# Patient Record
Sex: Male | Born: 1970 | Race: White | Hispanic: No | Marital: Single | Smoking: Current every day smoker
Health system: Southern US, Community
[De-identification: ages and names within clinical notes are randomized; demographics above are authoritative.]

## PROBLEM LIST (undated history)

## (undated) DIAGNOSIS — K8020 Calculus of gallbladder without cholecystitis without obstruction: Secondary | ICD-10-CM

## (undated) DIAGNOSIS — K219 Gastro-esophageal reflux disease without esophagitis: Secondary | ICD-10-CM

## (undated) DIAGNOSIS — K2970 Gastritis, unspecified, without bleeding: Secondary | ICD-10-CM

---

## 2001-05-28 ENCOUNTER — Emergency Department (HOSPITAL_COMMUNITY): Admission: EM | Admit: 2001-05-28 | Discharge: 2001-05-28 | Payer: Self-pay | Admitting: Emergency Medicine

## 2005-09-26 ENCOUNTER — Emergency Department (HOSPITAL_COMMUNITY): Admission: EM | Admit: 2005-09-26 | Discharge: 2005-09-26 | Payer: Self-pay | Admitting: Emergency Medicine

## 2007-07-03 ENCOUNTER — Emergency Department (HOSPITAL_COMMUNITY): Admission: EM | Admit: 2007-07-03 | Discharge: 2007-07-03 | Payer: Self-pay | Admitting: Emergency Medicine

## 2007-07-23 ENCOUNTER — Encounter: Admission: RE | Admit: 2007-07-23 | Discharge: 2007-07-23 | Payer: Self-pay | Admitting: Chiropractic Medicine

## 2009-12-06 ENCOUNTER — Emergency Department (HOSPITAL_COMMUNITY): Admission: EM | Admit: 2009-12-06 | Discharge: 2009-12-06 | Payer: Self-pay | Admitting: Emergency Medicine

## 2012-09-13 ENCOUNTER — Encounter (HOSPITAL_COMMUNITY): Payer: Self-pay | Admitting: *Deleted

## 2012-09-13 ENCOUNTER — Emergency Department (HOSPITAL_COMMUNITY)
Admission: EM | Admit: 2012-09-13 | Discharge: 2012-09-13 | Disposition: A | Discharge status: Home / Self Care | Payer: Self-pay | Attending: Emergency Medicine | Admitting: Emergency Medicine

## 2012-09-13 DIAGNOSIS — F17200 Nicotine dependence, unspecified, uncomplicated: Secondary | ICD-10-CM | POA: Insufficient documentation

## 2012-09-13 DIAGNOSIS — R220 Localized swelling, mass and lump, head: Secondary | ICD-10-CM | POA: Insufficient documentation

## 2012-09-13 DIAGNOSIS — K029 Dental caries, unspecified: Secondary | ICD-10-CM | POA: Insufficient documentation

## 2012-09-13 DIAGNOSIS — K047 Periapical abscess without sinus: Secondary | ICD-10-CM | POA: Insufficient documentation

## 2012-09-13 DIAGNOSIS — R51 Headache: Secondary | ICD-10-CM | POA: Insufficient documentation

## 2012-09-13 MED ORDER — OXYCODONE-ACETAMINOPHEN 5-325 MG PO TABS: 1.0000 | ORAL_TABLET | ORAL | Status: DC | PRN

## 2012-09-13 MED ORDER — PENICILLIN V POTASSIUM 250 MG PO TABS
500.0000 mg | ORAL_TABLET | Freq: Once | ORAL | Status: AC
  Administered 2012-09-13: 500 mg via ORAL
  Filled 2012-09-13: qty 2

## 2012-09-13 MED ORDER — PENICILLIN V POTASSIUM 500 MG PO TABS: 500.0000 mg | ORAL_TABLET | Freq: Three times a day (TID) | ORAL | Status: DC

## 2012-09-13 NOTE — ED Provider Notes (Signed)
Medical screening examination/treatment/procedure(s) were performed by non-physician practitioner and as supervising physician I was immediately available for consultation/collaboration.  Dione Booze, MD 09/13/12 [phone]

## 2012-09-13 NOTE — ED Notes (Signed)
Not answering

## 2012-09-13 NOTE — ED Notes (Signed)
The pt has had a  Toothache for 3 days

## 2012-09-13 NOTE — ED Provider Notes (Signed)
History     CSN: [id number]  Arrival date & time 09/13/12  2144   First MD Initiated Contact with Patient 09/13/12 2239      Chief Complaint  Patient presents with  . Dental Pain    (Consider location/radiation/quality/duration/timing/severity/associated sxs/prior treatment) Patient is a 42 y.o. male presenting with tooth pain. The history is provided by the patient.  Dental Pain Location:  Lower Associated symptoms: facial pain and facial swelling   Associated symptoms: no difficulty swallowing and no fever     History reviewed. No pertinent past medical history.  History reviewed. No pertinent past surgical history.  No family history on file.  History  Substance Use Topics  . Smoking status: Current Every Day Smoker  . Smokeless tobacco: Not on file  . Alcohol Use: Yes      Review of Systems  Constitutional: Negative for fever and chills.  HENT: Positive for facial swelling. Negative for ear pain and trouble swallowing.        Toothache.  Gastrointestinal: Negative.   Musculoskeletal: Negative for myalgias.  Neurological: Negative.     Allergies  Review of patient's allergies indicates no known allergies.  Home Medications   Current Outpatient Rx  Name  Route  Sig  Dispense  Refill  . acetaminophen (TYLENOL) 500 MG tablet   Oral   Take 500 mg by mouth every 6 (six) hours as needed for pain.         . benzocaine (ORAJEL) 10 % mucosal gel   Mouth/Throat   Use as directed 1 application in the mouth or throat every 10 (ten) minutes as needed for pain.           BP 123/76  Pulse 82  Temp(Src) 98 F (36.7 C)  Resp 20  SpO2 95%  Physical Exam  Constitutional: He is oriented to person, place, and time. He appears well-developed and well-nourished.  HENT:  Widespread dental decay with left facial swelling and tenderness.   Neck: Normal range of motion.  Pulmonary/Chest: Effort normal.  Musculoskeletal: Normal range of motion.  Neurological:  He is alert and oriented to person, place, and time.  Skin: Skin is warm and dry.  Psychiatric: He has a normal mood and affect.    ED Course  Procedures (including critical care time)  Labs Reviewed - No data to display No results found.   No diagnosis found.  1. Dental abscess  MDM  Dental abscess with widespread decay.         Arnoldo Hooker, PA-C 09/13/12 2335

## 2012-10-31 ENCOUNTER — Encounter (HOSPITAL_COMMUNITY): Payer: Self-pay | Admitting: Emergency Medicine

## 2012-10-31 ENCOUNTER — Emergency Department (HOSPITAL_COMMUNITY)
Admission: EM | Admit: 2012-10-31 | Discharge: 2012-10-31 | Disposition: A | Discharge status: Home / Self Care | Payer: Self-pay | Attending: Emergency Medicine | Admitting: Emergency Medicine

## 2012-10-31 DIAGNOSIS — F17200 Nicotine dependence, unspecified, uncomplicated: Secondary | ICD-10-CM | POA: Insufficient documentation

## 2012-10-31 DIAGNOSIS — R6883 Chills (without fever): Secondary | ICD-10-CM | POA: Insufficient documentation

## 2012-10-31 DIAGNOSIS — K029 Dental caries, unspecified: Secondary | ICD-10-CM | POA: Insufficient documentation

## 2012-10-31 MED ORDER — OXYCODONE-ACETAMINOPHEN 5-325 MG PO TABS
2.0000 | ORAL_TABLET | Freq: Once | ORAL | Status: AC
  Administered 2012-10-31: 2 via ORAL
  Filled 2012-10-31: qty 2

## 2012-10-31 MED ORDER — OXYCODONE-ACETAMINOPHEN 5-325 MG PO TABS: ORAL_TABLET | ORAL | Status: DC

## 2012-10-31 MED ORDER — AMOXICILLIN 500 MG PO CAPS: 500.0000 mg | ORAL_CAPSULE | Freq: Three times a day (TID) | ORAL | Status: DC

## 2012-10-31 NOTE — ED Notes (Signed)
Pt presents to emergency department today with tooth pain.  Pt states that he has been seen previous for same and was given antibiotics and pain meds for the tooth but because he has no dental insurance, he has not followed up with dentist.  Pain has been present for 5 days. None to very little facial swelling; however, tooth appears decayed.

## 2012-10-31 NOTE — ED Provider Notes (Signed)
Medical screening examination/treatment/procedure(s) were performed by non-physician practitioner and as supervising physician I was immediately available for consultation/collaboration.   Ashby Dawes, MD 10/31/12 [phone]

## 2012-10-31 NOTE — ED Notes (Signed)
Pt comfortable with d/c and f/u instructions. Prescriptions x2. 

## 2012-10-31 NOTE — ED Provider Notes (Signed)
History    This chart was scribed for Eric Flores, non-physician practitioner working with Ashby Dawes, MD by Leone Payor, ED Scribe. This patient was seen in room TR08C/TR08C and the patient's care was started at 1715.  CSN: [id number] Arrival date & time 10/31/12  1715  First MD Initiated Contact with Patient 10/31/12 1725     Chief Complaint  Patient presents with  . Dental Pain    The history is provided by the patient. No language interpreter was used.    HPI Comments: Eric Flores is a 42 y.o. male who presents to the Emergency Department complaining of ongoing, constant, gradually worsening dental pain to the upper left area starting 3-4 days ago. Pain is severe, 8/10,  Exacerbated by chewing. He reports having associated chills. Pt states he has been seen for similar symptoms 1 month ago for which he was prescribed antibiotics and pain medication. He denies following up with a dentist after his last tooth related visit to the ED. He has tried extra strength tylenol with relief. He denies fever, nausea, vomiting.   No past medical history on file. No past surgical history on file. No family history on file. History  Substance Use Topics  . Smoking status: Current Every Day Smoker  . Smokeless tobacco: Not on file  . Alcohol Use: Yes    Review of Systems  Constitutional: Negative for fever.  HENT: Positive for dental problem.   Respiratory: Negative for shortness of breath.   Cardiovascular: Negative for chest pain.  Gastrointestinal: Negative for nausea, vomiting, abdominal pain and diarrhea.  All other systems reviewed and are negative.    Allergies  Review of patient's allergies indicates no known allergies.  Home Medications   Current Outpatient Rx  Name  Route  Sig  Dispense  Refill  . acetaminophen (TYLENOL) 500 MG tablet   Oral   Take 500 mg by mouth every 6 (six) hours as needed for pain.         . benzocaine (ORAJEL) 10 % mucosal gel   Mouth/Throat   Use as directed 1 application in the mouth or throat every 10 (ten) minutes as needed for pain.         Marland Kitchen oxyCODONE-acetaminophen (PERCOCET/ROXICET) 5-325 MG per tablet   Oral   Take 1-2 tablets by mouth every 4 (four) hours as needed for pain.   15 tablet   0   . penicillin v potassium (VEETID) 500 MG tablet   Oral   Take 1 tablet (500 mg total) by mouth 3 (three) times daily.   30 tablet   0    BP 119/84  Pulse 76  Temp(Src) 97.4 F (36.3 C) (Oral)  Resp 18  SpO2 98% Physical Exam  Nursing note and vitals reviewed. Constitutional: He is oriented to person, place, and time. He appears well-developed and well-nourished. No distress.  HENT:  Head: Normocephalic.  Mouth/Throat: Oropharynx is clear and moist.    Generally poor dentition, no gingival swelling, erythema or tenderness to palpation. Patient is handling their secretions. There is no tenderness to palpation or firmness underneath tongue bilaterally. No trismus.    Eyes: Conjunctivae and EOM are normal. Pupils are equal, round, and reactive to light.  Neck: Normal range of motion.  Cardiovascular: Normal rate.   Pulmonary/Chest: Effort normal. No stridor.  Abdominal: Soft.  Musculoskeletal: Normal range of motion.  Neurological: He is alert and oriented to person, place, and time.  Psychiatric: He has a normal mood  and affect.    ED Course  Procedures (including critical care time)  DIAGNOSTIC STUDIES: Oxygen Saturation is 95% on RA, adequate by my interpretation.    COORDINATION OF CARE: 5:30 PM Discussed treatment plan with pt at bedside and pt agreed to plan.   Labs Reviewed - No data to display No results found. 1. Dental caries     MDM   Filed Vitals:   10/31/12 1724  BP: 119/84  Pulse: 76  Temp: 97.4 F (36.3 C)  TempSrc: Oral  Resp: 18  SpO2: 98%     Eric Flores is a 42 y.o. male Patient with toothache.  No gross abscess.  Exam unconcerning for Ludwig's angina or  spread of infection.  Will treat with penicillin and pain medicine.  Urged patient to follow-up with dentist.    Medications  oxyCODONE-acetaminophen (PERCOCET/ROXICET) 5-325 MG per tablet 2 tablet (not administered)    Pt is hemodynamically stable, appropriate for, and amenable to discharge at this time. Pt verbalized understanding and agrees with care plan. Outpatient follow-up and specific return precautions discussed.    New Prescriptions   AMOXICILLIN (AMOXIL) 500 MG CAPSULE    Take 1 capsule (500 mg total) by mouth 3 (three) times daily.   OXYCODONE-ACETAMINOPHEN (PERCOCET/ROXICET) 5-325 MG PER TABLET    1 to 2 tabs PO q6hrs  PRN for pain     Eric Emery, PA-C 10/31/12 1736

## 2013-04-29 ENCOUNTER — Encounter (HOSPITAL_COMMUNITY): Payer: Self-pay | Admitting: Emergency Medicine

## 2013-04-29 ENCOUNTER — Emergency Department (HOSPITAL_COMMUNITY): Payer: BC Managed Care – PPO

## 2013-04-29 ENCOUNTER — Emergency Department (HOSPITAL_COMMUNITY)
Admission: EM | Admit: 2013-04-29 | Discharge: 2013-04-29 | Disposition: A | Discharge status: Home / Self Care | Payer: BC Managed Care – PPO | Attending: Emergency Medicine | Admitting: Emergency Medicine

## 2013-04-29 DIAGNOSIS — R1013 Epigastric pain: Secondary | ICD-10-CM

## 2013-04-29 DIAGNOSIS — F17200 Nicotine dependence, unspecified, uncomplicated: Secondary | ICD-10-CM | POA: Insufficient documentation

## 2013-04-29 DIAGNOSIS — R1011 Right upper quadrant pain: Secondary | ICD-10-CM | POA: Insufficient documentation

## 2013-04-29 DIAGNOSIS — R110 Nausea: Secondary | ICD-10-CM | POA: Insufficient documentation

## 2013-04-29 LAB — CBC WITH DIFFERENTIAL/PLATELET
BASOS ABS: 0.1 10*3/uL (ref 0.0–0.1)
Basophils Relative: 0 % (ref 0–1)
EOS ABS: 0.2 10*3/uL (ref 0.0–0.7)
EOS PCT: 2 % (ref 0–5)
HCT: 44.2 % (ref 39.0–52.0)
Hemoglobin: 15.4 g/dL (ref 13.0–17.0)
LYMPHS ABS: 2.8 10*3/uL (ref 0.7–4.0)
Lymphocytes Relative: 23 % (ref 12–46)
MCH: 30.8 pg (ref 26.0–34.0)
MCHC: 34.8 g/dL (ref 30.0–36.0)
MCV: 88.4 fL (ref 78.0–100.0)
Monocytes Absolute: 0.7 10*3/uL (ref 0.1–1.0)
Monocytes Relative: 6 % (ref 3–12)
NEUTROS PCT: 69 % (ref 43–77)
Neutro Abs: 8.3 10*3/uL — ABNORMAL HIGH (ref 1.7–7.7)
PLATELETS: 283 10*3/uL (ref 150–400)
RBC: 5 MIL/uL (ref 4.22–5.81)
RDW: 12.5 % (ref 11.5–15.5)
WBC: 12.1 10*3/uL — AB (ref 4.0–10.5)

## 2013-04-29 LAB — COMPREHENSIVE METABOLIC PANEL
ALBUMIN: 4 g/dL (ref 3.5–5.2)
ALT: 40 U/L (ref 0–53)
AST: 32 U/L (ref 0–37)
Alkaline Phosphatase: 111 U/L (ref 39–117)
BUN: 10 mg/dL (ref 6–23)
CALCIUM: 9.4 mg/dL (ref 8.4–10.5)
CO2: 23 mEq/L (ref 19–32)
CREATININE: 0.85 mg/dL (ref 0.50–1.35)
Chloride: 102 mEq/L (ref 96–112)
GFR calc Af Amer: >90 mL/min (ref >90)
GFR calc non Af Amer: >90 mL/min (ref >90)
Glucose, Bld: 89 mg/dL (ref 70–99)
Potassium: 4.6 mEq/L (ref 3.7–5.3)
SODIUM: 140 meq/L (ref 137–147)
TOTAL PROTEIN: 7.3 g/dL (ref 6.0–8.3)
Total Bilirubin: 0.2 mg/dL — ABNORMAL LOW (ref 0.3–1.2)

## 2013-04-29 LAB — URINALYSIS, ROUTINE W REFLEX MICROSCOPIC
BILIRUBIN URINE: NEGATIVE
GLUCOSE, UA: NEGATIVE mg/dL
Hgb urine dipstick: NEGATIVE
KETONES UR: NEGATIVE mg/dL
LEUKOCYTES UA: NEGATIVE
Nitrite: NEGATIVE
PH: 6 (ref 5.0–8.0)
Protein, ur: NEGATIVE mg/dL
Specific Gravity, Urine: 1.022 (ref 1.005–1.030)
Urobilinogen, UA: 0.2 mg/dL (ref 0.0–1.0)

## 2013-04-29 LAB — LIPASE, BLOOD: LIPASE: 41 U/L (ref 11–59)

## 2013-04-29 MED ORDER — FAMOTIDINE 20 MG PO TABS
20.0000 mg | ORAL_TABLET | Freq: Two times a day (BID) | ORAL | Status: DC
Start: 2013-04-29 — End: 2013-12-04

## 2013-04-29 MED ORDER — PANTOPRAZOLE SODIUM 20 MG PO TBEC: 20.0000 mg | DELAYED_RELEASE_TABLET | Freq: Every day | ORAL | Status: DC

## 2013-04-29 NOTE — ED Notes (Signed)
Pt has been having abd pain epigastric that radates to llq since around x mas time. Pain is worse at night when lying down. Pt has had bm normal , states that he has notice maybe it does get worse after eating .

## 2013-04-29 NOTE — Discharge Instructions (Signed)
Your ultrasound and blood work did not show significant findings. See diet recommendations below. Follow up with primary care doctor. Diet for Gastroesophageal Reflux Disease, Adult Reflux (acid reflux) is when acid from your stomach flows up into the esophagus. When acid comes in contact with the esophagus, the acid causes irritation and soreness (inflammation) in the esophagus. When reflux happens often or so severely that it causes damage to the esophagus, it is called gastroesophageal reflux disease (GERD). Nutrition therapy can help ease the discomfort of GERD. FOODS OR DRINKS TO AVOID OR LIMIT  Smoking or chewing tobacco. Nicotine is one of the most potent stimulants to acid production in the gastrointestinal tract.  Caffeinated and decaffeinated coffee and black tea.  Regular or low-calorie carbonated beverages or energy drinks (caffeine-free carbonated beverages are allowed).   Strong spices, such as black pepper, white pepper, red pepper, cayenne, curry powder, and chili powder.  Peppermint or spearmint.  Chocolate.  High-fat foods, including meats and fried foods. Extra added fats including oils, butter, salad dressings, and nuts. Limit these to less than 8 tsp per day.  Fruits and vegetables if they are not tolerated, such as citrus fruits or tomatoes.  Alcohol.  Any food that seems to aggravate your condition. If you have questions regarding your diet, call your caregiver or a registered dietitian. OTHER THINGS THAT MAY HELP GERD INCLUDE:   Eating your meals slowly, in a relaxed setting.  Eating 5 to 6 small meals per day instead of 3 large meals.  Eliminating food for a period of time if it causes distress.  Not lying down until 3 hours after eating a meal.  Keeping the head of your bed raised 6 to 9 inches (15 to 23 cm) by using a foam wedge or blocks under the legs of the bed. Lying flat may make symptoms worse.  Being physically active. Weight loss may be helpful  in reducing reflux in overweight or obese adults.  Wear loose fitting clothing EXAMPLE MEAL PLAN This meal plan is approximately 2,000 calories based on https://www.[place].org/ChooseMyPlate.gov meal planning guidelines. Breakfast   cup cooked oatmeal.  1 cup strawberries.  1 cup low-fat milk.  1 oz almonds. Snack  1 cup cucumber slices.  6 oz yogurt (made from low-fat or fat-free milk). Lunch  2 slice whole-wheat bread.  2 oz sliced [place].  2 tsp mayonnaise.  1 cup blueberries.  1 cup snap peas. Snack  6 whole-wheat crackers.  1 oz string cheese. Dinner   cup brown rice.  1 cup mixed veggies.  1 tsp olive oil.  3 oz grilled fish. Document Released: 04/03/2005 Document Revised: 06/26/2011 Document Reviewed: 02/17/2011 [facility] Patient Information 2014 [employer], [place].

## 2013-04-29 NOTE — ED Provider Notes (Signed)
CSN: [id number]     Arrival date & time 04/29/13  1511 History   First MD Initiated Contact with Patient 04/29/13 1644     Chief Complaint  Patient presents with  . Abdominal Pain   (Consider location/radiation/quality/duration/timing/severity/associated sxs/prior Treatment) HPI Eric Flores is a 43 y.o. male who presents to emergency department complaining of abdominal pain. Patient states that he has had pain in his abdomen since New Year's Day. He states this pain comes and goes. States it's mainly after eating and at nighttime. He reports pain is in epigastric area in the right upper quadrant and radiates to the back. He reports associated nausea. He denies any vomiting or diarrhea. He states pain is also worse when laying down flat. He has not tried any medications for this. He denies any prior similar symptoms. He states the pain comes and goes. He reports no current pain. He states the reason he came to the ER because that is the pain he had this morning with severe.  History reviewed. No pertinent past medical history. History reviewed. No pertinent past surgical history. No family history on file. History  Substance Use Topics  . Smoking status: Current Every Day Smoker  . Smokeless tobacco: Never Used  . Alcohol Use: Yes     Comment: occassional    Review of Systems  Constitutional: Negative for fever and chills.  HENT: Negative for congestion.   Respiratory: Negative for cough, chest tightness and shortness of breath.   Cardiovascular: Negative for chest pain, palpitations and leg swelling.  Gastrointestinal: Positive for nausea and abdominal pain. Negative for vomiting, diarrhea and abdominal distention.  Genitourinary: Negative for dysuria, urgency, frequency and hematuria.  Musculoskeletal: Negative for arthralgias, myalgias, neck pain and neck stiffness.  Skin: Negative for rash.  Allergic/Immunologic: Negative for immunocompromised state.  Neurological: Negative for  dizziness, weakness, light-headedness, numbness and headaches.    Allergies  Review of patient's allergies indicates no known allergies.  Home Medications   Current Outpatient Rx  Name  Route  Sig  Dispense  Refill  . acetaminophen (TYLENOL) 500 MG tablet   Oral   Take 500 mg by mouth every 6 (six) hours as needed for pain.         Marland Kitchen ibuprofen (ADVIL,MOTRIN) 200 MG tablet   Oral   Take 400 mg by mouth every 6 (six) hours as needed for moderate pain.         . Multiple Vitamin (MULTIVITAMIN WITH MINERALS) TABS tablet   Oral   Take 1 tablet by mouth daily.          BP 125/71  Pulse 80  Temp(Src) 98.2 F (36.8 C) (Oral)  Resp 16  SpO2 96% Physical Exam  Nursing note and vitals reviewed. Constitutional: He is oriented to person, place, and time. He appears well-developed and well-nourished. No distress.  HENT:  Head: Normocephalic.  Eyes: Conjunctivae are normal.  Neck: Neck supple.  Cardiovascular: Normal rate, regular rhythm and normal heart sounds.   Pulmonary/Chest: Effort normal and breath sounds normal. No respiratory distress. He has no wheezes. He has no rales.  Abdominal: Soft. Bowel sounds are normal. He exhibits no distension. There is tenderness. There is no rebound and no guarding.  RUQ tenderness, epigastric tenderness  Musculoskeletal: He exhibits no edema.  Neurological: He is alert and oriented to person, place, and time.  Skin: Skin is warm and dry.    ED Course  Procedures (including critical care time) Labs Review Labs Reviewed  CBC WITH DIFFERENTIAL - Abnormal; Notable for the following:    WBC 12.1 (*)    Neutro Abs 8.3 (*)    All other components within normal limits  COMPREHENSIVE METABOLIC PANEL - Abnormal; Notable for the following:    Total Bilirubin 0.2 (*)    All other components within normal limits  URINALYSIS, ROUTINE W REFLEX MICROSCOPIC  LIPASE, BLOOD   Imaging Review [place] Abdomen Complete  04/29/2013   CLINICAL DATA:   Right upper quadrant pain  EXAM: ULTRASOUND ABDOMEN COMPLETE  COMPARISON:  None.  FINDINGS: Gallbladder:  Contracted consistent with postprandial state. No findings to suggest cholelithiasis are noted.  Common bile duct:  Diameter: 3.3 mm.  Liver:  No focal lesion identified. Within normal limits in parenchymal echogenicity.  IVC:  No abnormality visualized.  Pancreas:  Visualized portion unremarkable.  Spleen:  Size and appearance within normal limits.  Right Kidney:  Length: 10.7 cm. Echogenicity within normal limits. No mass or hydronephrosis visualized.  Left Kidney:  Length: 10.8 cm. Echogenicity within normal limits. No mass or hydronephrosis visualized.  Abdominal aorta:  No aneurysm visualized.  Other findings:  None.  IMPRESSION: Contracted gallbladder due to the postprandial state.  No other focal abnormality is noted.   Electronically Signed   By: Alcide CleverMark  Lukens M.D.   On: 04/29/2013 19:03    EKG Interpretation   None       MDM   1. Epigastric abdominal pain     Pt with epigastric pain and RUQ pain. Pain is intermittent. Currently pain free. Labs and US abdomen pending to rule out cholecystitis.    8:09 PM Pt's US negative except for contracted gallbladder, so suspect not best quality test at this time. . Labs unremarkable other than elevate wbc at 12 with no left shift. Pt is currently symptom free. Given negative labs, US, pt feeling well at present will d/c home with outpatient follow up. Instructed to improve his diet, cut down on alcohol, quit smoking, reduce caffeine, no spicy foods, no NSAIDs. Pt states he takes NSAIDs daily for chronic pain. Will start on pepcid and prilosec. Follow up with pcp.   Filed Vitals:   04/29/13 1527  BP: 125/71  Pulse: 80  Temp: 98.2 F (36.8 C)  TempSrc: Oral  Resp: 16  SpO2: 96%      Lottie Musselatyana A Esiquio Boesen, PA-C 04/29/13 2011

## 2013-04-30 NOTE — ED Provider Notes (Signed)
Medical screening examination/treatment/procedure(s) were conducted as a shared visit with non-physician practitioner(s) and myself.  I personally evaluated the patient during the encounter.  EKG Interpretation   None       Pt c/o right upper abd pain. Intermittent.?worse w certain foods or when eats heavy meal. No cp. No sob. No fever or chills. abd soft mild ruq tenderness.   Eric RootsKevin E Denetra Formoso, MD 04/30/13 [phone]

## 2013-11-11 ENCOUNTER — Encounter (HOSPITAL_COMMUNITY): Payer: Self-pay | Admitting: Emergency Medicine

## 2013-11-11 ENCOUNTER — Emergency Department (HOSPITAL_COMMUNITY)
Admission: EM | Admit: 2013-11-11 | Discharge: 2013-11-12 | Disposition: A | Discharge status: Home / Self Care | Payer: BC Managed Care – PPO | Attending: Emergency Medicine | Admitting: Emergency Medicine

## 2013-11-11 DIAGNOSIS — R197 Diarrhea, unspecified: Secondary | ICD-10-CM | POA: Insufficient documentation

## 2013-11-11 DIAGNOSIS — R112 Nausea with vomiting, unspecified: Secondary | ICD-10-CM | POA: Insufficient documentation

## 2013-11-11 DIAGNOSIS — Z79899 Other long term (current) drug therapy: Secondary | ICD-10-CM | POA: Insufficient documentation

## 2013-11-11 MED ORDER — ONDANSETRON HCL 4 MG/2ML IJ SOLN
4.0000 mg | Freq: Once | INTRAMUSCULAR | Status: AC
  Administered 2013-11-12: 4 mg via INTRAVENOUS
  Filled 2013-11-11: qty 2

## 2013-11-11 MED ORDER — SODIUM CHLORIDE 0.9 % IV SOLN
1000.0000 mL | INTRAVENOUS | Status: DC
  Administered 2013-11-12: 1000 mL via INTRAVENOUS

## 2013-11-11 MED ORDER — LOPERAMIDE HCL 2 MG PO CAPS
4.0000 mg | ORAL_CAPSULE | Freq: Once | ORAL | Status: AC
  Administered 2013-11-12: 4 mg via ORAL
  Filled 2013-11-11: qty 2

## 2013-11-11 MED ORDER — SODIUM CHLORIDE 0.9 % IV SOLN
1000.0000 mL | Freq: Once | INTRAVENOUS | Status: AC
  Administered 2013-11-12: 1000 mL via INTRAVENOUS

## 2013-11-11 NOTE — ED Provider Notes (Signed)
CSN: [id number]     Arrival date & time 11/11/13  2250 History  This chart was scribed for Dione Booze, MD by Nicholos Johns, ED scribe. This patient was seen in room APA18/APA18 and the patient's care was started at 11:46 PM.    Chief Complaint  Patient presents with  . Emesis   The history is provided by the patient. No language interpreter was used.   HPI Comments: Eric Flores is a 43 y.o. male w/ hx of gastritis presents to the Emergency Department complaining of nausea, vomiting, and diarrhea; onset last PM. Reports 4-5 episodes of emesis. 2-3 episodes of diarrhea. Subjective fever this AM; did not take a temperature. Notes 8/10 abdominal cramping. States his mother has a stomach virus and believes he may have caught the same thing. Admits to smoking 1 pack of cigarettes daily.   History reviewed. No pertinent past medical history. History reviewed. No pertinent past surgical history. History reviewed. No pertinent family history. History  Substance Use Topics  . Smoking status: Current Every Day Smoker  . Smokeless tobacco: Never Used  . Alcohol Use: Yes     Comment: occassional    Review of Systems  Constitutional: Negative for fever.  Gastrointestinal: Positive for vomiting and diarrhea.  All other systems reviewed and are negative.  Allergies  Review of patient's allergies indicates no known allergies.  Home Medications   Prior to Admission medications   Medication Sig Start Date End Date Taking? Authorizing Provider  acetaminophen (TYLENOL) 500 MG tablet Take 500 mg by mouth every 6 (six) hours as needed for pain.    Historical Provider, MD  famotidine (PEPCID) 20 MG tablet Take 1 tablet (20 mg total) by mouth 2 (two) times daily. 04/29/13   Tatyana A Kirichenko, PA-C  ibuprofen (ADVIL,MOTRIN) 200 MG tablet Take 400 mg by mouth every 6 (six) hours as needed for moderate pain.    Historical Provider, MD  Multiple Vitamin (MULTIVITAMIN WITH MINERALS) TABS tablet Take  1 tablet by mouth daily.    Historical Provider, MD  pantoprazole (PROTONIX) 20 MG tablet Take 1 tablet (20 mg total) by mouth daily. 04/29/13   Tatyana A Kirichenko, PA-C   Triage vitals: BP 96/78  Pulse 71  Temp(Src) 98.2 F (36.8 C) (Oral)  Resp 16  Ht 5\' 8"  (1.727 m)  Wt 150 lb (68.04 kg)  BMI 22.81 kg/m2  SpO2 98%  Physical Exam  Nursing note and vitals reviewed. Constitutional: He is oriented to person, place, and time. He appears well-developed and well-nourished. No distress.  HENT:  Head: Normocephalic and atraumatic.  Eyes: Conjunctivae and EOM are normal. Pupils are equal, round, and reactive to light.  Neck: Normal range of motion. Neck supple. No JVD present. No tracheal deviation present.  Cardiovascular: Normal rate and regular rhythm.   No murmur heard. Pulmonary/Chest: Effort normal and breath sounds normal. No respiratory distress. He has no wheezes. He has no rales.  Abdominal: Soft. He exhibits no distension and no mass. Bowel sounds are decreased. There is no tenderness.  Musculoskeletal: Normal range of motion. He exhibits no edema.  Lymphadenopathy:    He has no cervical adenopathy.  Neurological: He is alert and oriented to person, place, and time. He has normal reflexes. No cranial nerve deficit. He exhibits normal muscle tone. Coordination normal.  Skin: Skin is warm and dry. No rash noted.  Psychiatric: He has a normal mood and affect. His behavior is normal. Thought content normal.    ED Course  Procedures (including critical care time) DIAGNOSTIC STUDIES: Oxygen Saturation is 98% on room air, normal by my interpretation.    COORDINATION OF CARE: At 11:49 PM: Discussed treatment plan with patient which includes IV fluids and medication for the nausea and diarrhea. Patient agrees.    Labs Review Results for orders placed during the hospital encounter of 11/11/13  CBC WITH DIFFERENTIAL      Result Value Ref Range   WBC 8.8  4.0 - 10.5 K/uL   RBC  4.84  4.22 - 5.81 MIL/uL   Hemoglobin 15.0  13.0 - 17.0 g/dL   HCT 16.143.5  09.639.0 - 04.552.0 %   MCV 89.9  78.0 - 100.0 fL   MCH 31.0  26.0 - 34.0 pg   MCHC 34.5  30.0 - 36.0 g/dL   RDW 40.913.1  81.111.5 - 91.415.5 %   Platelets 266  150 - 400 K/uL   Neutrophils Relative % 47  43 - 77 %   Neutro Abs 4.2  1.7 - 7.7 K/uL   Lymphocytes Relative 41  12 - 46 %   Lymphs Abs 3.6  0.7 - 4.0 K/uL   Monocytes Relative 9  3 - 12 %   Monocytes Absolute 0.8  0.1 - 1.0 K/uL   Eosinophils Relative 2  0 - 5 %   Eosinophils Absolute 0.2  0.0 - 0.7 K/uL   Basophils Relative 1  0 - 1 %   Basophils Absolute 0.1  0.0 - 0.1 K/uL  BASIC METABOLIC PANEL      Result Value Ref Range   Sodium 140  137 - 147 mEq/L   Potassium 4.0  3.7 - 5.3 mEq/L   Chloride 101  96 - 112 mEq/L   CO2 28  19 - 32 mEq/L   Glucose, Bld 104 (*) 70 - 99 mg/dL   BUN 16  6 - 23 mg/dL   Creatinine, Ser 7.820.94  0.50 - 1.35 mg/dL   Calcium 9.4  8.4 - 95.610.5 mg/dL   GFR calc non Af Amer >90  >90 mL/min   GFR calc Af Amer >90  >90 mL/min   Anion gap 11  5 - 15   MDM   Final diagnoses:  Nausea vomiting and diarrhea   Nausea, vomiting, diarrhea, body aches consistent with viral gastroenteritis. He'll be given IV fluids, IV ondansetron, and oral loperamide.  Laboratory workup was unremarkable. He feels significantly better after above noted treatment. She is sent home with a prescription for ondansetron and is told to use over-the-counter loperamide as needed.  I personally performed the services described in this documentation, which was scribed in my presence. The recorded information has been reviewed and is accurate.     Dione Boozeavid Molly Maselli, MD 11/12/13 [phone]

## 2013-11-11 NOTE — ED Notes (Signed)
Patient complaining of nausea, vomiting, diarrhea.

## 2013-11-12 LAB — CBC WITH DIFFERENTIAL/PLATELET
Basophils Absolute: 0.1 10*3/uL (ref 0.0–0.1)
Basophils Relative: 1 % (ref 0–1)
EOS ABS: 0.2 10*3/uL (ref 0.0–0.7)
EOS PCT: 2 % (ref 0–5)
HEMATOCRIT: 43.5 % (ref 39.0–52.0)
HEMOGLOBIN: 15 g/dL (ref 13.0–17.0)
LYMPHS ABS: 3.6 10*3/uL (ref 0.7–4.0)
Lymphocytes Relative: 41 % (ref 12–46)
MCH: 31 pg (ref 26.0–34.0)
MCHC: 34.5 g/dL (ref 30.0–36.0)
MCV: 89.9 fL (ref 78.0–100.0)
MONO ABS: 0.8 10*3/uL (ref 0.1–1.0)
MONOS PCT: 9 % (ref 3–12)
NEUTROS PCT: 47 % (ref 43–77)
Neutro Abs: 4.2 10*3/uL (ref 1.7–7.7)
Platelets: 266 10*3/uL (ref 150–400)
RBC: 4.84 MIL/uL (ref 4.22–5.81)
RDW: 13.1 % (ref 11.5–15.5)
WBC: 8.8 10*3/uL (ref 4.0–10.5)

## 2013-11-12 LAB — BASIC METABOLIC PANEL
Anion gap: 11 (ref 5–15)
BUN: 16 mg/dL (ref 6–23)
CO2: 28 mEq/L (ref 19–32)
CREATININE: 0.94 mg/dL (ref 0.50–1.35)
Calcium: 9.4 mg/dL (ref 8.4–10.5)
Chloride: 101 mEq/L (ref 96–112)
GFR calc Af Amer: >90 mL/min (ref >90)
GLUCOSE: 104 mg/dL — AB (ref 70–99)
POTASSIUM: 4 meq/L (ref 3.7–5.3)
Sodium: 140 mEq/L (ref 137–147)

## 2013-11-12 MED ORDER — ONDANSETRON HCL 4 MG PO TABS: 4.0000 mg | ORAL_TABLET | Freq: Four times a day (QID) | ORAL | Status: DC | PRN

## 2013-11-12 NOTE — Discharge Instructions (Signed)
Take loperamide (Imodium AD) as needed for diarrhea. ° °Nausea and Vomiting °Nausea is a sick feeling that often comes before throwing up (vomiting). Vomiting is a reflex where stomach contents come out of your mouth. Vomiting can cause severe loss of body fluids (dehydration). Children and elderly adults can become dehydrated quickly, especially if they also have diarrhea. Nausea and vomiting are symptoms of a condition or disease. It is important to find the cause of your symptoms. °CAUSES  °· Direct irritation of the stomach lining. This irritation can result from increased acid production (gastroesophageal reflux disease), infection, food poisoning, taking certain medicines (such as nonsteroidal anti-inflammatory drugs), alcohol use, or tobacco use. °· Signals from the brain. These signals could be caused by a headache, heat exposure, an inner ear disturbance, increased pressure in the brain from injury, infection, a tumor, or a concussion, pain, emotional stimulus, or metabolic problems. °· An obstruction in the gastrointestinal tract (bowel obstruction). °· Illnesses such as diabetes, hepatitis, gallbladder problems, appendicitis, kidney problems, cancer, sepsis, atypical symptoms of a heart attack, or eating disorders. °· Medical treatments such as chemotherapy and radiation. °· Receiving medicine that makes you sleep (general anesthetic) during surgery. °DIAGNOSIS °Your caregiver may ask for tests to be done if the problems do not improve after a few days. Tests may also be done if symptoms are severe or if the reason for the nausea and vomiting is not clear. Tests may include: °· Urine tests. °· Blood tests. °· Stool tests. °· Cultures (to look for evidence of infection). °· X-rays or other imaging studies. °Test results can help your caregiver make decisions about treatment or the need for additional tests. °TREATMENT °You need to stay well hydrated. Drink frequently but in small amounts. You may wish to  drink water, sports drinks, clear broth, or eat frozen ice pops or gelatin dessert to help stay hydrated. When you eat, eating slowly may help prevent nausea. There are also some antinausea medicines that may help prevent nausea. °HOME CARE INSTRUCTIONS  °· Take all medicine as directed by your caregiver. °· If you do not have an appetite, do not force yourself to eat. However, you must continue to drink fluids. °· If you have an appetite, eat a normal diet unless your caregiver tells you differently. °· Eat a variety of complex carbohydrates (rice, wheat, potatoes, bread), lean meats, yogurt, fruits, and vegetables. °· Avoid high-fat foods because they are more difficult to digest. °· Drink enough water and fluids to keep your urine clear or pale yellow. °· If you are dehydrated, ask your caregiver for specific rehydration instructions. Signs of dehydration may include: °· Severe thirst. °· Dry lips and mouth. °· Dizziness. °· Dark urine. °· Decreasing urine frequency and amount. °· Confusion. °· Rapid breathing or pulse. °SEEK IMMEDIATE MEDICAL CARE IF:  °· You have blood or brown flecks (like coffee grounds) in your vomit. °· You have black or bloody stools. °· You have a severe headache or stiff neck. °· You are confused. °· You have severe abdominal pain. °· You have chest pain or trouble breathing. °· You do not urinate at least once every 8 hours. °· You develop cold or clammy skin. °· You continue to vomit for longer than 24 to 48 hours. °· You have a fever. °MAKE SURE YOU:  °· Understand these instructions. °· Will watch your condition. °· Will get help right away if you are not doing well or get worse. °Document Released: 04/03/2005 Document Revised: 06/26/2011 Document Reviewed: 08/31/2010 °ExitCare® Patient   Information ©2015 [employer]. This information is not intended to replace advice given to you by your health care provider. Make sure you discuss any questions you have with your health care  provider. ° °Diarrhea °Diarrhea is frequent loose and watery bowel movements. It can cause you to feel weak and dehydrated. Dehydration can cause you to become tired and thirsty, have a dry mouth, and have decreased urination that often is dark yellow. Diarrhea is a sign of another problem, most often an infection that will not last long. In most cases, diarrhea typically lasts 2-3 days. However, it can last longer if it is a sign of something more serious. It is important to treat your diarrhea as directed by your caregiver to lessen or prevent future episodes of diarrhea. °CAUSES  °Some common causes include: °· Gastrointestinal infections caused by viruses, bacteria, or parasites. °· Food poisoning or food allergies. °· Certain medicines, such as antibiotics, chemotherapy, and laxatives. °· Artificial sweeteners and fructose. °· Digestive disorders. °HOME CARE INSTRUCTIONS °· Ensure adequate fluid intake (hydration): Have 1 cup (8 oz) of fluid for each diarrhea episode. Avoid fluids that contain simple sugars or sports drinks, fruit juices, whole milk products, and sodas. Your urine should be clear or pale yellow if you are drinking enough fluids. Hydrate with an oral rehydration solution that you can purchase at pharmacies, retail stores, and online. You can prepare an oral rehydration solution at home by mixing the following ingredients together: °¨  - tsp table salt. °¨ ¾ tsp baking soda. °¨  tsp salt substitute containing potassium chloride. °¨ 1  tablespoons sugar. °¨ 1 L (34 oz) of water. °· Certain foods and beverages may increase the speed at which food moves through the gastrointestinal (GI) tract. These foods and beverages should be avoided and include: °¨ Caffeinated and alcoholic beverages. °¨ High-fiber foods, such as raw fruits and vegetables, nuts, seeds, and whole grain breads and cereals. °¨ Foods and beverages sweetened with sugar alcohols, such as xylitol, sorbitol, and mannitol. °· Some foods  may be well tolerated and may help thicken stool including: °¨ Starchy foods, such as rice, toast, pasta, low-sugar cereal, oatmeal, grits, baked potatoes, crackers, and bagels. °¨ Bananas. °¨ Applesauce. °· Add probiotic-rich foods to help increase healthy bacteria in the GI tract, such as yogurt and fermented milk products. °· Wash your hands well after each diarrhea episode. °· Only take over-the-counter or prescription medicines as directed by your caregiver. °· Take a warm bath to relieve any burning or pain from frequent diarrhea episodes. °SEEK IMMEDIATE MEDICAL CARE IF:  °· You are unable to keep fluids down. °· You have persistent vomiting. °· You have blood in your stool, or your stools are black and tarry. °· You do not urinate in 6-8 hours, or there is only a small amount of very dark urine. °· You have abdominal pain that increases or localizes. °· You have weakness, dizziness, confusion, or light-headedness. °· You have a severe headache. °· Your diarrhea gets worse or does not get better. °· You have a fever or persistent symptoms for more than 2-3 days. °· You have a fever and your symptoms suddenly get worse. °MAKE SURE YOU:  °· Understand these instructions. °· Will watch your condition. °· Will get help right away if you are not doing well or get worse. °Document Released: 03/24/2002 Document Revised: 08/18/2013 Document Reviewed: 12/10/2011 °ExitCare® Patient Information ©2015 [employer]. This information is not intended to replace advice given to you   by your health care provider. Make sure you discuss any questions you have with your health care provider. ° °Ondansetron tablets °What is this medicine? °ONDANSETRON (on DAN se tron) is used to treat nausea and vomiting caused by chemotherapy. It is also used to prevent or treat nausea and vomiting after surgery. °This medicine may be used for other purposes; ask your health care provider or pharmacist if you have questions. °COMMON BRAND  NAME(S): Zofran °What should I tell my health care provider before I take this medicine? °They need to know if you have any of these conditions: °-heart disease °-history of irregular heartbeat °-liver disease °-low levels of magnesium or potassium in the blood °-an unusual or allergic reaction to ondansetron, granisetron, other medicines, foods, dyes, or preservatives °-pregnant or trying to get pregnant °-breast-feeding °How should I use this medicine? °Take this medicine by mouth with a glass of water. Follow the directions on your prescription label. Take your doses at regular intervals. Do not take your medicine more often than directed. °Talk to your pediatrician regarding the use of this medicine in children. Special care may be needed. °Overdosage: If you think you have taken too much of this medicine contact a poison control center or emergency room at once. °NOTE: This medicine is only for you. Do not share this medicine with others. °What if I miss a dose? °If you miss a dose, take it as soon as you can. If it is almost time for your next dose, take only that dose. Do not take double or extra doses. °What may interact with this medicine? °Do not take this medicine with any of the following medications: °-apomorphine °-certain medicines for fungal infections like fluconazole, itraconazole, ketoconazole, posaconazole, voriconazole °-cisapride °-dofetilide °-dronedarone °-pimozide °-thioridazine °-ziprasidone °This medicine may also interact with the following medications: °-carbamazepine °-certain medicines for depression, anxiety, or psychotic disturbances °-fentanyl °-linezolid °-MAOIs like Carbex, Eldepryl, Marplan, Nardil, and Parnate °-methylene blue (injected into a vein) °-other medicines that prolong the QT interval (cause an abnormal heart rhythm) °-phenytoin °-rifampicin °-tramadol °This list may not describe all possible interactions. Give your health care provider a list of all the medicines,  herbs, non-prescription drugs, or dietary supplements you use. Also tell them if you smoke, drink alcohol, or use illegal drugs. Some items may interact with your medicine. °What should I watch for while using this medicine? °Check with your doctor or health care professional right away if you have any sign of an allergic reaction. °What side effects may I notice from receiving this medicine? °Side effects that you should report to your doctor or health care professional as soon as possible: °-allergic reactions like skin rash, itching or hives, swelling of the face, lips or tongue °-breathing problems °-confusion °-dizziness °-fast or irregular heartbeat °-feeling faint or lightheaded, falls °-fever and chills °-loss of balance or coordination °-seizures °-sweating °-swelling of the hands or feet °-tightness in the chest °-tremors °-unusually weak or tired °Side effects that usually do not require medical attention (report to your doctor or health care professional if they continue or are bothersome): °-constipation or diarrhea °-headache °This list may not describe all possible side effects. Call your doctor for medical advice about side effects. You may report side effects to FDA at 1-800-FDA-1088. °Where should I keep my medicine? °Keep out of the reach of children. °Store between 2 and 30 degrees C (36 and 86 degrees F). Throw away any unused medicine after the expiration date. °NOTE: This sheet is a   summary. It may not cover all possible information. If you have questions about this medicine, talk to your doctor, pharmacist, or health care provider. °© 2015, [employer]. (2013-01-08 16:27:45) ° °

## 2013-11-24 MED FILL — Ondansetron HCl Tab 4 MG: ORAL | Qty: 4 | Status: AC

## 2013-12-04 ENCOUNTER — Encounter (HOSPITAL_COMMUNITY): Payer: Self-pay | Admitting: Emergency Medicine

## 2013-12-04 ENCOUNTER — Emergency Department (HOSPITAL_COMMUNITY): Payer: BC Managed Care – PPO

## 2013-12-04 ENCOUNTER — Emergency Department (HOSPITAL_COMMUNITY)
Admission: EM | Admit: 2013-12-04 | Discharge: 2013-12-04 | Disposition: A | Discharge status: Home / Self Care | Payer: BC Managed Care – PPO | Attending: Emergency Medicine | Admitting: Emergency Medicine

## 2013-12-04 DIAGNOSIS — F17200 Nicotine dependence, unspecified, uncomplicated: Secondary | ICD-10-CM | POA: Insufficient documentation

## 2013-12-04 DIAGNOSIS — K8020 Calculus of gallbladder without cholecystitis without obstruction: Secondary | ICD-10-CM

## 2013-12-04 DIAGNOSIS — R079 Chest pain, unspecified: Secondary | ICD-10-CM | POA: Insufficient documentation

## 2013-12-04 DIAGNOSIS — Z79899 Other long term (current) drug therapy: Secondary | ICD-10-CM | POA: Insufficient documentation

## 2013-12-04 DIAGNOSIS — R197 Diarrhea, unspecified: Secondary | ICD-10-CM | POA: Insufficient documentation

## 2013-12-04 DIAGNOSIS — R1011 Right upper quadrant pain: Secondary | ICD-10-CM

## 2013-12-04 HISTORY — DX: Gastritis, unspecified, without bleeding: K29.70

## 2013-12-04 LAB — CBC WITH DIFFERENTIAL/PLATELET
Basophils Absolute: 0.1 10*3/uL (ref 0.0–0.1)
Basophils Relative: 0 % (ref 0–1)
EOS ABS: 0.2 10*3/uL (ref 0.0–0.7)
EOS PCT: 2 % (ref 0–5)
HCT: 42.7 % (ref 39.0–52.0)
Hemoglobin: 14.7 g/dL (ref 13.0–17.0)
LYMPHS ABS: 2.2 10*3/uL (ref 0.7–4.0)
Lymphocytes Relative: 20 % (ref 12–46)
MCH: 30.9 pg (ref 26.0–34.0)
MCHC: 34.4 g/dL (ref 30.0–36.0)
MCV: 89.9 fL (ref 78.0–100.0)
MONOS PCT: 6 % (ref 3–12)
Monocytes Absolute: 0.7 10*3/uL (ref 0.1–1.0)
Neutro Abs: 8.3 10*3/uL — ABNORMAL HIGH (ref 1.7–7.7)
Neutrophils Relative %: 72 % (ref 43–77)
PLATELETS: 270 10*3/uL (ref 150–400)
RBC: 4.75 MIL/uL (ref 4.22–5.81)
RDW: 12.7 % (ref 11.5–15.5)
WBC: 11.4 10*3/uL — ABNORMAL HIGH (ref 4.0–10.5)

## 2013-12-04 LAB — URINALYSIS, ROUTINE W REFLEX MICROSCOPIC
Bilirubin Urine: NEGATIVE
GLUCOSE, UA: NEGATIVE mg/dL
Hgb urine dipstick: NEGATIVE
KETONES UR: NEGATIVE mg/dL
Leukocytes, UA: NEGATIVE
Nitrite: NEGATIVE
PH: 5.5 (ref 5.0–8.0)
Protein, ur: NEGATIVE mg/dL
Specific Gravity, Urine: <1.005 — ABNORMAL LOW (ref 1.005–1.030)
Urobilinogen, UA: 0.2 mg/dL (ref 0.0–1.0)

## 2013-12-04 LAB — COMPREHENSIVE METABOLIC PANEL
ALT: 10 U/L (ref 0–53)
ANION GAP: 11 (ref 5–15)
AST: 12 U/L (ref 0–37)
Albumin: 3.9 g/dL (ref 3.5–5.2)
Alkaline Phosphatase: 95 U/L (ref 39–117)
BUN: 17 mg/dL (ref 6–23)
CALCIUM: 9.5 mg/dL (ref 8.4–10.5)
CO2: 26 mEq/L (ref 19–32)
Chloride: 104 mEq/L (ref 96–112)
Creatinine, Ser: 0.94 mg/dL (ref 0.50–1.35)
GFR calc non Af Amer: >90 mL/min (ref >90)
GLUCOSE: 127 mg/dL — AB (ref 70–99)
Potassium: 4.2 mEq/L (ref 3.7–5.3)
SODIUM: 141 meq/L (ref 137–147)
TOTAL PROTEIN: 7.2 g/dL (ref 6.0–8.3)
Total Bilirubin: 0.2 mg/dL — ABNORMAL LOW (ref 0.3–1.2)

## 2013-12-04 LAB — LIPASE, BLOOD: Lipase: 85 U/L — ABNORMAL HIGH (ref 11–59)

## 2013-12-04 MED ORDER — ONDANSETRON HCL 8 MG PO TABS: 8.0000 mg | ORAL_TABLET | Freq: Three times a day (TID) | ORAL | Status: DC | PRN

## 2013-12-04 MED ORDER — IOHEXOL 300 MG/ML  SOLN
100.0000 mL | Freq: Once | INTRAMUSCULAR | Status: AC | PRN
  Administered 2013-12-04: 100 mL via INTRAVENOUS

## 2013-12-04 MED ORDER — FAMOTIDINE IN NACL 20-0.9 MG/50ML-% IV SOLN
20.0000 mg | Freq: Once | INTRAVENOUS | Status: AC
  Administered 2013-12-04: 20 mg via INTRAVENOUS
  Filled 2013-12-04: qty 50

## 2013-12-04 MED ORDER — OXYCODONE-ACETAMINOPHEN 5-325 MG PO TABS: 1.0000 | ORAL_TABLET | ORAL | Status: DC | PRN

## 2013-12-04 MED ORDER — HYDROMORPHONE HCL PF 1 MG/ML IJ SOLN
1.0000 mg | Freq: Once | INTRAMUSCULAR | Status: AC
  Administered 2013-12-04: 1 mg via INTRAVENOUS
  Filled 2013-12-04: qty 1

## 2013-12-04 MED ORDER — IOHEXOL 300 MG/ML  SOLN
25.0000 mL | Freq: Once | INTRAMUSCULAR | Status: AC | PRN
  Administered 2013-12-04: 25 mL via ORAL

## 2013-12-04 MED ORDER — ONDANSETRON HCL 4 MG/2ML IJ SOLN
4.0000 mg | Freq: Once | INTRAMUSCULAR | Status: AC
  Administered 2013-12-04: 4 mg via INTRAVENOUS
  Filled 2013-12-04: qty 2

## 2013-12-04 MED ORDER — SODIUM CHLORIDE 0.9 % IV SOLN
Freq: Once | INTRAVENOUS | Status: AC
  Administered 2013-12-04: 09:00:00 via INTRAVENOUS

## 2013-12-04 NOTE — Discharge Instructions (Signed)
Cholelithiasis Cholelithiasis (also called gallstones) is a form of gallbladder disease. The gallbladder is a small organ that helps you digest fats. Symptoms of gallstones are:  Feeling sick to your stomach (nausea).  Throwing up (vomiting).  Belly pain.  Yellowing of the skin (jaundice).  Sudden pain. You may feel the pain for minutes to hours.  Fever.  Pain to the touch. HOME CARE  Only take medicines as told by your doctor.  Eat a low-fat diet until you see your doctor again. Eating fat can result in pain.  Follow up with your doctor as told. Attacks usually happen time after time. Surgery is usually needed for permanent treatment. GET HELP RIGHT AWAY IF:   Your pain gets worse.  Your pain is not helped by medicines.  You have a fever and lasting symptoms for more than 2-3 days.  You have a fever and your symptoms suddenly get worse.  You keep feeling sick to your stomach and throwing up. MAKE SURE YOU:   Understand these instructions.  Will watch your condition.  Will get help right away if you are not doing well or get worse. Document Released: 09/20/2007 Document Revised: 12/04/2012 Document Reviewed: 09/25/2012 [facility] Patient Information 2015 [place], [place]. This information is not intended to replace advice given to you by your health care provider. Make sure you discuss any questions you have with your health care provider.  Low-Fat Diet for Pancreatitis or Gallbladder Conditions A low-fat diet can be helpful if you have pancreatitis or a gallbladder condition. With these conditions, your pancreas and gallbladder have trouble digesting fats. A healthy eating plan with less fat will help rest your pancreas and gallbladder and reduce your symptoms. WHAT DO I NEED TO KNOW ABOUT THIS DIET?  Eat a low-fat diet.  Reduce your fat intake to less than 20-30% of your total daily calories. This is less than 50-60 g of fat per day.  Remember that you need some fat  in your diet. Ask your dietician what your daily goal should be.  Choose nonfat and low-fat healthy foods. Look for the words "nonfat," "low fat," or "fat free."  As a guide, look on the label and choose foods with less than 3 g of fat per serving. Eat only one serving.  Avoid alcohol.  Do not smoke. If you need help quitting, talk with your health care provider.  Eat small frequent meals instead of three large heavy meals. WHAT FOODS CAN I EAT? Grains Include healthy grains and starches such as potatoes, wheat bread, fiber-rich cereal, and brown rice. Choose whole grain options whenever possible. In adults, whole grains should account for 45-65% of your daily calories.  Fruits and Vegetables Eat plenty of fruits and vegetables. Fresh fruits and vegetables add fiber to your diet. Meats and Other Protein Sources Eat lean meat such as chicken and pork. Trim any fat off of meat before cooking it. Eggs, fish, and beans are other sources of protein. In adults, these foods should account for 10-35% of your daily calories. Dairy Choose low-fat milk and dairy options. Dairy includes fat and protein, as well as calcium.  Fats and Oils Limit high-fat foods such as fried foods, sweets, baked goods, sugary drinks.  Other Creamy sauces and condiments, such as mayonnaise, can add extra fat. Think about whether or not you need to use them, or use smaller amounts or low fat options. WHAT FOODS ARE NOT RECOMMENDED?  High fat foods, such as:  T[employer]oro CorporationBaked goods.  Ice cream.  [place] toast.  Sweet rolls.  Pizza.  Cheese bread.  Foods covered with batter, butter, creamy sauces, or cheese.  Fried foods.  Sugary drinks and desserts.  Foods that cause gas or bloating Document Released: 04/08/2013 Document Reviewed: 04/08/2013 [facility] Patient Information 2015 [place]Care, [place]. This information is not intended to replace advice given to you by your health care provider. Make sure you discuss any  questions you have with your health care provider.

## 2013-12-04 NOTE — ED Notes (Signed)
Pt. Walking back from restroom, told when he needs to use restroom again that we need a urine sample.

## 2013-12-04 NOTE — Care Management Note (Signed)
ED/CM noted patient did not have health insurance and/or PCP listed in the computer.  [facility] [place] [facility] [place] [facility] with information on the clinics, food pantries, and the handout for new health insurance sign-up. Pt was also given a Rx discount card. Pt states he has a new job, and will actually have insurance in about a month, if all goes well with this job. Pt expresses appreciation for information given, and states he can use this in the meantime, while waiting for work insurance to go into affect.

## 2013-12-04 NOTE — ED Notes (Signed)
Contrast given to pt. By CT

## 2013-12-04 NOTE — ED Provider Notes (Signed)
CSN: [id number]     Arrival date & time 12/04/13  4098 History   First MD Initiated Contact with Patient 12/04/13 [phone]     Chief Complaint  Patient presents with  . Abdominal Pain   Eric Flores is a 43 y.o. male who presents to the Emergency Department complaining of sudden onset of RUQ pain that woke him from sleep.  Was seen in January at [facility] and diagnosed with gastritis and "gallbladder contractions".    (Consider location/radiation/quality/duration/timing/severity/associated sxs/prior Treatment) Patient is a 43 y.o. male presenting with abdominal pain. The history is provided by the patient.  Abdominal Pain Pain location:  RUQ Pain quality: sharp and stabbing   Pain radiates to:  Back Pain severity:  Moderate Onset quality:  Gradual Timing:  Constant Progression:  Unchanged Chronicity:  Recurrent Context: awakening from sleep   Context: not medication withdrawal, not recent illness, not sick contacts, not suspicious food intake and not trauma   Relieved by:  Nothing Worsened by:  Nothing tried Ineffective treatments:  Antacids Associated symptoms: chest pain, diarrhea, nausea and vomiting   Associated symptoms: no chills, no dysuria, no fever, no flatus, no hematemesis, no hematochezia, no melena and no shortness of breath   Risk factors: no alcohol abuse and has not had multiple surgeries     Past Medical History  Diagnosis Date  . Gastritis    History reviewed. No pertinent past surgical history. History reviewed. No pertinent family history. History  Substance Use Topics  . Smoking status: Current Every Day Smoker -- 1.00 packs/day  . Smokeless tobacco: Never Used  . Alcohol Use: No    Review of Systems  Constitutional: Negative for fever, chills and appetite change.  Respiratory: Negative for shortness of breath.   Cardiovascular: Positive for chest pain.  Gastrointestinal: Positive for nausea, vomiting, abdominal pain and diarrhea. Negative for blood  in stool, melena, hematochezia, abdominal distention, flatus and hematemesis.  Genitourinary: Negative for dysuria, flank pain, decreased urine volume and difficulty urinating.  Musculoskeletal: Positive for back pain.  Skin: Negative for color change and rash.  Neurological: Negative for dizziness, weakness and numbness.  Hematological: Negative for adenopathy.  All other systems reviewed and are negative.     Allergies  Review of patient's allergies indicates no known allergies.  Home Medications   Prior to Admission medications   Medication Sig Start Date End Date Taking? Authorizing Provider  acetaminophen (TYLENOL) 500 MG tablet Take 500 mg by mouth every 6 (six) hours as needed for pain.    Historical Provider, MD  famotidine (PEPCID) 20 MG tablet Take 1 tablet (20 mg total) by mouth 2 (two) times daily. 04/29/13   Tatyana A Kirichenko, PA-C  ibuprofen (ADVIL,MOTRIN) 200 MG tablet Take 400 mg by mouth every 6 (six) hours as needed for moderate pain.    Historical Provider, MD  Multiple Vitamin (MULTIVITAMIN WITH MINERALS) TABS tablet Take 1 tablet by mouth daily.    Historical Provider, MD  ondansetron (ZOFRAN) 4 MG tablet Take 1 tablet (4 mg total) by mouth every 6 (six) hours as needed for nausea. 11/12/13   Dione Booze, MD  pantoprazole (PROTONIX) 20 MG tablet Take 1 tablet (20 mg total) by mouth daily. 04/29/13   Tatyana A Kirichenko, PA-C   BP 133/65  Pulse 63  Temp(Src) 98.3 F (36.8 C) (Oral)  Resp 18  Ht 5\' 9"  (1.753 m)  Wt 150 lb (68.04 kg)  BMI 22.14 kg/m2  SpO2 100% Physical Exam  Nursing note  and vitals reviewed. Constitutional: He is oriented to person, place, and time. He appears well-developed and well-nourished.  Pt is uncomfortable appearing  HENT:  Head: Normocephalic and atraumatic.  Mouth/Throat: Uvula is midline. Mucous membranes are dry.  Neck: Normal range of motion. Neck supple.  Cardiovascular: Normal rate, regular rhythm, normal heart sounds and  intact distal pulses.   No murmur heard. Pulmonary/Chest: Effort normal and breath sounds normal. No respiratory distress.  Abdominal: Soft. Normal appearance and bowel sounds are normal. He exhibits no distension and no mass. There is no hepatosplenomegaly. There is tenderness in the right upper quadrant and epigastric area. There is no rigidity, no rebound, no guarding and no CVA tenderness.  Diffuse ttp of the right abdomen.    Musculoskeletal: Normal range of motion. He exhibits no edema.  Lymphadenopathy:    He has no cervical adenopathy.  Neurological: He is alert and oriented to person, place, and time. He exhibits normal muscle tone. Coordination normal.  Skin: Skin is warm and dry.    ED Course  Procedures (including critical care time) Labs Review Labs Reviewed  CBC WITH DIFFERENTIAL - Abnormal; Notable for the following:    WBC 11.4 (*)    Neutro Abs 8.3 (*)    All other components within normal limits  COMPREHENSIVE METABOLIC PANEL - Abnormal; Notable for the following:    Glucose, Bld 127 (*)    Total Bilirubin 0.2 (*)    All other components within normal limits  LIPASE, BLOOD - Abnormal; Notable for the following:    Lipase 85 (*)    All other components within normal limits  URINALYSIS, ROUTINE W REFLEX MICROSCOPIC - Abnormal; Notable for the following:    Specific Gravity, Urine <1.005 (*)    All other components within normal limits    Imaging Review Ct Abdomen Pelvis W Contrast  12/04/2013   CLINICAL DATA:  Right upper quadrant pain, nausea  EXAM: CT ABDOMEN AND PELVIS WITH CONTRAST  TECHNIQUE: Multidetector CT imaging of the abdomen and pelvis was performed using the standard protocol following bolus administration of intravenous contrast.  CONTRAST:  25mL OMNIPAQUE IOHEXOL 300 MG/ML SOLN, OMNIPAQUE IOHEXOL 300 MG/ML SOLN  COMPARISON:  None.  FINDINGS: Sagittal images of the spine shows multilevel degenerative changes thoracolumbar spine. There is Schmorl's  node deformity upper endplate of L4 vertebral body. Disc space flattening with mild posterior spurring and endplate sclerotic changes at L1-L2 and L4-L5 level.  The lung bases are unremarkable.  The liver shows no biliary ductal dilatation. There is a hypervascular lesion in right hepatic lobe anterior to the gallbladder measures about 2.5 cm. This may represent focal nodular hyperplasia, atypical hemangioma or fatty sparing. Further correlation with MRI is recommended as clinically warranted.  The gallbladder shows small layering calcified gallstones. No pericholecystic fluid.  Moderate gastric distension with contrast. No gastric outlet obstruction is suggested.  The pancreas, spleen and adrenal glands are unremarkable. Kidneys are symmetrical in size and enhancement. No hydronephrosis or hydroureter.  Delayed renal images shows bilateral renal symmetrical excretion. Bilateral visualized proximal ureter is unremarkable.  No aortic aneurysm.  No small bowel obstruction.  No ascites or free air.  No adenopathy.  There is no pericecal inflammation. The terminal ileum is unremarkable. There is a retrocecal appendix with normal appearance. The tip of the appendix is going cephalad adjacent to inferior aspect of the right hepatic lobe.  Prostate gland and seminal vesicles are unremarkable. No distal colonic obstruction. There is a left inguinal scrotal  canal hernia containing fat measures 1.5 cm. No evidence of acute complication.  IMPRESSION: 1. There is hypervascular lesion in right hepatic lobe anterior to the gallbladder measures 0.5 cm. Further correlation with MRI is recommended as clinically warranted. 2. Small layering calcified gallstones are noted within gallbladder. 3. Normal appendix.  No pericecal inflammation. 4. No hydronephrosis or hydroureter. 5. No small bowel obstruction.   Electronically Signed   By: Natasha MeadLiviu  Pop M.D.   On: 12/04/2013 10:22    EKG Intrepretation  nml sinus rhythm, nml QT interval,  nml axis, no significant ST changes, no previous EKG for comparison  Read by Dr. Estell HarpinZammit  MDM   Final diagnoses:  Right upper quadrant pain  Calculus of gallbladder without cholecystitis without obstruction    0910  Patient is resting comfortably after IV medications.  Labs reviewed, discussed findings with Dr. Estell HarpinZammit, will order CT abd/pelvis  1100 Consulted Dr. Lovell SheehanJenkins and discussed findings,  Also reviewed and discussed labs and CT results with the patient.  patient reports starting a new job and is now feeling better, pain has resolved and he prefers discharge at this time and will call Dr. Lovell SheehanJenkins office to arrange a f/u appt and he also agrees to return here if the sx's return or worsen.  Dr. Lovell SheehanJenkins is agreeable to plan.    Eric Brodhead L. Romir Klimowicz, PA-C 12/06/13 0022

## 2013-12-04 NOTE — ED Notes (Signed)
Pt reports RUQ abdominal pain/distention,n/v/d. Pt reports was seen for same x6 months ago for same and was dx with gastritis and "gallbladder contraction." pt reports most recent flare-up around 4am this am. nad noted.

## 2013-12-08 NOTE — ED Provider Notes (Signed)
Medical screening examination/treatment/procedure(s) were performed by non-physician practitioner and as supervising physician I was immediately available for consultation/collaboration.   EKG Interpretation   Date/Time:  Thursday December 04 2013 08:58:47 EDT Ventricular Rate:  57 PR Interval:  149 QRS Duration: 88 QT Interval:  418 QTC Calculation: 407 R Axis:   84 Text Interpretation:  Sinus rhythm ST elev, probable normal early repol  pattern ED PHYSICIAN INTERPRETATION AVAILABLE IN [facility] Confirmed  by TEST, Record ([id number]) on 12/06/2013 11:09:55 AM        Benny Lennert, MD 12/08/13 [phone]

## 2013-12-24 ENCOUNTER — Emergency Department (HOSPITAL_COMMUNITY)
Admission: EM | Admit: 2013-12-24 | Discharge: 2013-12-24 | Disposition: A | Discharge status: Home / Self Care | Payer: BC Managed Care – PPO | Attending: Emergency Medicine | Admitting: Emergency Medicine

## 2013-12-24 ENCOUNTER — Encounter (HOSPITAL_COMMUNITY): Payer: Self-pay | Admitting: Emergency Medicine

## 2013-12-24 DIAGNOSIS — R1011 Right upper quadrant pain: Secondary | ICD-10-CM | POA: Insufficient documentation

## 2013-12-24 DIAGNOSIS — F17200 Nicotine dependence, unspecified, uncomplicated: Secondary | ICD-10-CM | POA: Insufficient documentation

## 2013-12-24 DIAGNOSIS — K8020 Calculus of gallbladder without cholecystitis without obstruction: Secondary | ICD-10-CM | POA: Insufficient documentation

## 2013-12-24 DIAGNOSIS — K8050 Calculus of bile duct without cholangitis or cholecystitis without obstruction: Secondary | ICD-10-CM

## 2013-12-24 DIAGNOSIS — Z8719 Personal history of other diseases of the digestive system: Secondary | ICD-10-CM | POA: Insufficient documentation

## 2013-12-24 LAB — COMPREHENSIVE METABOLIC PANEL
ALBUMIN: 3.9 g/dL (ref 3.5–5.2)
ALT: 13 U/L (ref 0–53)
AST: 14 U/L (ref 0–37)
Alkaline Phosphatase: 97 U/L (ref 39–117)
Anion gap: 10 (ref 5–15)
BUN: 14 mg/dL (ref 6–23)
CO2: 27 mEq/L (ref 19–32)
CREATININE: 0.83 mg/dL (ref 0.50–1.35)
Calcium: 9.4 mg/dL (ref 8.4–10.5)
Chloride: 102 mEq/L (ref 96–112)
GFR calc Af Amer: >90 mL/min (ref >90)
GFR calc non Af Amer: >90 mL/min (ref >90)
Glucose, Bld: 117 mg/dL — ABNORMAL HIGH (ref 70–99)
Potassium: 4.3 mEq/L (ref 3.7–5.3)
Sodium: 139 mEq/L (ref 137–147)
TOTAL PROTEIN: 7 g/dL (ref 6.0–8.3)

## 2013-12-24 LAB — CBC WITH DIFFERENTIAL/PLATELET
BASOS PCT: 0 % (ref 0–1)
Basophils Absolute: 0.1 10*3/uL (ref 0.0–0.1)
EOS ABS: 0.2 10*3/uL (ref 0.0–0.7)
EOS PCT: 2 % (ref 0–5)
HEMATOCRIT: 40.4 % (ref 39.0–52.0)
HEMOGLOBIN: 14.1 g/dL (ref 13.0–17.0)
Lymphocytes Relative: 19 % (ref 12–46)
Lymphs Abs: 2.4 10*3/uL (ref 0.7–4.0)
MCH: 31.6 pg (ref 26.0–34.0)
MCHC: 34.9 g/dL (ref 30.0–36.0)
MCV: 90.6 fL (ref 78.0–100.0)
MONO ABS: 0.7 10*3/uL (ref 0.1–1.0)
MONOS PCT: 6 % (ref 3–12)
Neutro Abs: 9.4 10*3/uL — ABNORMAL HIGH (ref 1.7–7.7)
Neutrophils Relative %: 73 % (ref 43–77)
Platelets: 246 10*3/uL (ref 150–400)
RBC: 4.46 MIL/uL (ref 4.22–5.81)
RDW: 12.6 % (ref 11.5–15.5)
WBC: 12.8 10*3/uL — ABNORMAL HIGH (ref 4.0–10.5)

## 2013-12-24 LAB — LIPASE, BLOOD: LIPASE: 57 U/L (ref 11–59)

## 2013-12-24 MED ORDER — HYDROMORPHONE HCL PF 1 MG/ML IJ SOLN
1.0000 mg | Freq: Once | INTRAMUSCULAR | Status: AC
  Administered 2013-12-24: 1 mg via INTRAVENOUS
  Filled 2013-12-24: qty 1

## 2013-12-24 MED ORDER — ONDANSETRON HCL 4 MG/2ML IJ SOLN
4.0000 mg | Freq: Once | INTRAMUSCULAR | Status: AC
  Administered 2013-12-24: 4 mg via INTRAVENOUS
  Filled 2013-12-24: qty 2

## 2013-12-24 MED ORDER — OXYCODONE-ACETAMINOPHEN 5-325 MG PO TABS: 1.0000 | ORAL_TABLET | ORAL | Status: DC | PRN

## 2013-12-24 MED ORDER — METOCLOPRAMIDE HCL 10 MG PO TABS: 10.0000 mg | ORAL_TABLET | Freq: Four times a day (QID) | ORAL | Status: DC | PRN

## 2013-12-24 MED ORDER — ONDANSETRON HCL 4 MG PO TABS: 4.0000 mg | ORAL_TABLET | Freq: Four times a day (QID) | ORAL | Status: DC | PRN

## 2013-12-24 MED ORDER — SODIUM CHLORIDE 0.9 % IV SOLN: 1000.0000 mL | INTRAVENOUS | Status: DC

## 2013-12-24 MED ORDER — SODIUM CHLORIDE 0.9 % IV SOLN
1000.0000 mL | Freq: Once | INTRAVENOUS | Status: AC
  Administered 2013-12-24: 1000 mL via INTRAVENOUS

## 2013-12-24 NOTE — Discharge Instructions (Signed)
Stay on a low fat diet. ° ° °Biliary Colic  °Biliary colic is a steady or irregular pain in the upper abdomen. It is usually under the right side of the rib cage. It happens when gallstones interfere with the normal flow of bile from the gallbladder. Bile is a liquid that helps to digest fats. Bile is made in the liver and stored in the gallbladder. When you eat a meal, bile passes from the gallbladder through the cystic duct and the common bile duct into the small intestine. There, it mixes with partially digested food. If a gallstone blocks either of these ducts, the normal flow of bile is blocked. The muscle cells in the bile duct contract forcefully to try to move the stone. This causes the pain of biliary colic.  °SYMPTOMS  °· A person with biliary colic usually complains of pain in the upper abdomen. This pain can be: °¨ In the center of the upper abdomen just below the breastbone. °¨ In the upper-right part of the abdomen, near the gallbladder and liver. °¨ Spread back toward the right shoulder blade. °· Nausea and vomiting. °· The pain usually occurs after eating. °· Biliary colic is usually triggered by the digestive system's demand for bile. The demand for bile is high after fatty meals. Symptoms can also occur when a person who has been fasting suddenly eats a very large meal. Most episodes of biliary colic pass after 1 to 5 hours. After the most intense pain passes, your abdomen may continue to ache mildly for about 24 hours. °DIAGNOSIS  °After you describe your symptoms, your caregiver will perform a physical exam. He or she will pay attention to the upper right portion of your belly (abdomen). This is the area of your liver and gallbladder. An ultrasound will help your caregiver look for gallstones. Specialized scans of the gallbladder may also be done. Blood tests may be done, especially if you have fever or if your pain persists. °PREVENTION  °Biliary colic can be prevented by controlling the risk  factors for gallstones. Some of these risk factors, such as heredity, increasing age, and pregnancy are a normal part of life. Obesity and a high-fat diet are risk factors you can change through a healthy lifestyle. Women going through menopause who take hormone replacement therapy (estrogen) are also more likely to develop biliary colic. °TREATMENT  °· Pain medication may be prescribed. °· You may be encouraged to eat a fat-free diet. °· If the first episode of biliary colic is severe, or episodes of colic keep retuning, surgery to remove the gallbladder (cholecystectomy) is usually recommended. This procedure can be done through small incisions using an instrument called a laparoscope. The procedure often requires a brief stay in the hospital. Some people can leave the hospital the same day. It is the most widely used treatment in people troubled by painful gallstones. It is effective and safe, with no complications in more than 90% of cases. °· If surgery cannot be done, medication that dissolves gallstones may be used. This medication is expensive and can take months or years to work. Only small stones will dissolve. °· Rarely, medication to dissolve gallstones is combined with a procedure called shock-wave lithotripsy. This procedure uses carefully aimed shock waves to break up gallstones. In many people treated with this procedure, gallstones form again within a few years. °PROGNOSIS  °If gallstones block your cystic duct or common bile duct, you are at risk for repeated episodes of biliary colic. There is   also a 25% chance that you will develop a gallbladder infection(acute cholecystitis), or some other complication of gallstones within 10 to 20 years. If you have surgery, schedule it at a time that is convenient for you and at a time when you are not sick. HOME CARE INSTRUCTIONS   Drink plenty of clear fluids.  Avoid fatty, greasy or fried foods, or any foods that make your pain worse.  Take  medications as directed. SEEK MEDICAL CARE IF:   You develop a fever over 100.5 F (38.1 C).  Your pain gets worse over time.  You develop nausea that prevents you from eating and drinking.  You develop vomiting. SEEK IMMEDIATE MEDICAL CARE IF:   You have continuous or severe belly (abdominal) pain which is not relieved with medications.  You develop nausea and vomiting which is not relieved with medications.  You have symptoms of biliary colic and you suddenly develop a fever and shaking chills. This may signal cholecystitis. Call your caregiver immediately.  You develop a yellow color to your skin or the white part of your eyes (jaundice). Document Released: 09/04/2005 Document Revised: 06/26/2011 Document Reviewed: 11/14/2007 [facility] Patient Information 2015 [place], [place]. This information is not intended to replace advice given to you by your health care provider. Make sure you discuss any questions you have with your health care provider.  Metoclopramide tablets What is this medicine? METOCLOPRAMIDE (met oh kloe PRA mide) is used to treat the symptoms of gastroesophageal reflux disease (GERD) like heartburn. It is also used to treat people with slow emptying of the stomach and intestinal tract. This medicine may be used for other purposes; ask your health care provider or pharmacist if you have questions. COMMON BRAND NAME(S): Reglan What should I tell my health care provider before I take this medicine? They need to know if you have any of these conditions: -breast cancer -depression -diabetes -heart failure -high blood pressure -kidney disease -liver disease -Parkinson's disease or a movement disorder -pheochromocytoma -seizures -stomach obstruction, bleeding, or perforation -an unusual or allergic reaction to metoclopramide, procainamide, sulfites, other medicines, foods, dyes, or preservatives -pregnant or trying to get pregnant -breast-feeding How should I use  this medicine? Take this medicine by mouth with a glass of water. Follow the directions on the prescription label. Take this medicine on an empty stomach, about 30 minutes before eating. Take your doses at regular intervals. Do not take your medicine more often than directed. Do not stop taking except on the advice of your doctor or health care professional. A special MedGuide will be given to you by the pharmacist with each prescription and refill. Be sure to read this information carefully each time. Talk to your pediatrician regarding the use of this medicine in children. Special care may be needed. Overdosage: If you think you have taken too much of this medicine contact a poison control center or emergency room at once. NOTE: This medicine is only for you. Do not share this medicine with others. What if I miss a dose? If you miss a dose, take it as soon as you can. If it is almost time for your next dose, take only that dose. Do not take double or extra doses. What may interact with this medicine? -acetaminophen -cyclosporine -digoxin -medicines for blood pressure -medicines for diabetes, including insulin -medicines for hay fever and other allergies -medicines for depression, especially an Monoamine Oxidase Inhibitor (MAOI) -medicines for Parkinson's disease, like levodopa -medicines for sleep or for pain -tetracycline This list  may not describe all possible interactions. Give your health care provider a list of all the medicines, herbs, non-prescription drugs, or dietary supplements you use. Also tell them if you smoke, drink alcohol, or use illegal drugs. Some items may interact with your medicine. What should I watch for while using this medicine? It may take a few weeks for your stomach condition to start to get better. However, do not take this medicine for longer than 12 weeks. The longer you take this medicine, and the more you take it, the greater your chances are of developing  serious side effects. If you are an elderly patient, a male patient, or you have diabetes, you may be at an increased risk for side effects from this medicine. Contact your doctor immediately if you start having movements you cannot control such as lip smacking, rapid movements of the tongue, involuntary or uncontrollable movements of the eyes, head, arms and legs, or muscle twitches and spasms. Patients and their families should watch out for worsening depression or thoughts of suicide. Also watch out for any sudden or severe changes in feelings such as feeling anxious, agitated, panicky, irritable, hostile, aggressive, impulsive, severely restless, overly excited and hyperactive, or not being able to sleep. If this happens, especially at the beginning of treatment or after a change in dose, call your doctor. Do not treat yourself for high fever. Ask your doctor or health care professional for advice. You may get drowsy or dizzy. Do not drive, use machinery, or do anything that needs mental alertness until you know how this drug affects you. Do not stand or sit up quickly, especially if you are an older patient. This reduces the risk of dizzy or fainting spells. Alcohol can make you more drowsy and dizzy. Avoid alcoholic drinks. What side effects may I notice from receiving this medicine? Side effects that you should report to your doctor or health care professional as soon as possible: -allergic reactions like skin rash, itching or hives, swelling of the face, lips, or tongue -abnormal production of milk in females -breast enlargement in both males and females -change in the way you walk -difficulty moving, speaking or swallowing -drooling, lip smacking, or rapid movements of the tongue -excessive sweating -fever -involuntary or uncontrollable movements of the eyes, head, arms and legs -irregular heartbeat or palpitations -muscle twitches and spasms -unusually weak or tired Side effects that  usually do not require medical attention (report to your doctor or health care professional if they continue or are bothersome): -change in sex drive or performance -depressed mood -diarrhea -difficulty sleeping -headache -menstrual changes -restless or nervous This list may not describe all possible side effects. Call your doctor for medical advice about side effects. You may report side effects to FDA at 1-800-FDA-1088. Where should I keep my medicine? Keep out of the reach of children. Store at room temperature between 20 and 25 degrees C (68 and 77 degrees F). Protect from light. Keep container tightly closed. Throw away any unused medicine after the expiration date. NOTE: This sheet is a summary. It may not cover all possible information. If you have questions about this medicine, talk to your doctor, pharmacist, or health care provider.  2015, [employer]vier/[employer] Standard. (2011-08-01 13:04:38)  Acetaminophen; Oxycodone tablets What is this medicine? ACETAMINOPHEN; OXYCODONE (a set a MEE noe fen; ox i KOE done) is a pain reliever. It is used to treat mild to moderate pain. This medicine may be used for other purposes; ask your health care provider  or pharmacist if you have questions. COMMON BRAND NAME(S): Endocet, Magnacet, Narvox, Percocet, Perloxx, Primalev, Primlev, Roxicet, Xolox What should I tell my health care provider before I take this medicine? They need to know if you have any of these conditions: -brain tumor -Crohn's disease, inflammatory bowel disease, or ulcerative colitis -drug abuse or addiction -head injury -heart or circulation problems -if you often drink alcohol -kidney disease or problems going to the bathroom -liver disease -lung disease, asthma, or breathing problems -an unusual or allergic reaction to acetaminophen, oxycodone, other opioid analgesics, other medicines, foods, dyes, or preservatives -pregnant or trying to get pregnant -breast-feeding How  should I use this medicine? Take this medicine by mouth with a full glass of water. Follow the directions on the prescription label. Take your medicine at regular intervals. Do not take your medicine more often than directed. Talk to your pediatrician regarding the use of this medicine in children. Special care may be needed. Patients over 56 years old may have a stronger reaction and need a smaller dose. Overdosage: If you think you have taken too much of this medicine contact a poison control center or emergency room at once. NOTE: This medicine is only for you. Do not share this medicine with others. What if I miss a dose? If you miss a dose, take it as soon as you can. If it is almost time for your next dose, take only that dose. Do not take double or extra doses. What may interact with this medicine? -alcohol -antihistamines -barbiturates like amobarbital, butalbital, butabarbital, methohexital, pentobarbital, phenobarbital, thiopental, and secobarbital -benztropine -drugs for bladder problems like solifenacin, trospium, oxybutynin, tolterodine, hyoscyamine, and methscopolamine -drugs for breathing problems like ipratropium and tiotropium -drugs for certain stomach or intestine problems like propantheline, homatropine methylbromide, glycopyrrolate, atropine, belladonna, and dicyclomine -general anesthetics like etomidate, ketamine, nitrous oxide, propofol, desflurane, enflurane, halothane, isoflurane, and sevoflurane -medicines for depression, anxiety, or psychotic disturbances -medicines for sleep -muscle relaxants -naltrexone -narcotic medicines (opiates) for pain -phenothiazines like perphenazine, thioridazine, chlorpromazine, mesoridazine, fluphenazine, prochlorperazine, promazine, and trifluoperazine -scopolamine -tramadol -trihexyphenidyl This list may not describe all possible interactions. Give your health care provider a list of all the medicines, herbs, non-prescription  drugs, or dietary supplements you use. Also tell them if you smoke, drink alcohol, or use illegal drugs. Some items may interact with your medicine. What should I watch for while using this medicine? Tell your doctor or health care professional if your pain does not go away, if it gets worse, or if you have new or a different type of pain. You may develop tolerance to the medicine. Tolerance means that you will need a higher dose of the medication for pain relief. Tolerance is normal and is expected if you take this medicine for a long time. Do not suddenly stop taking your medicine because you may develop a severe reaction. Your body becomes used to the medicine. This does NOT mean you are addicted. Addiction is a behavior related to getting and using a drug for a non-medical reason. If you have pain, you have a medical reason to take pain medicine. Your doctor will tell you how much medicine to take. If your doctor wants you to stop the medicine, the dose will be slowly lowered over time to avoid any side effects. You may get drowsy or dizzy. Do not drive, use machinery, or do anything that needs mental alertness until you know how this medicine affects you. Do not stand or sit up quickly, especially if  you are an older patient. This reduces the risk of dizzy or fainting spells. Alcohol may interfere with the effect of this medicine. Avoid alcoholic drinks. There are different types of narcotic medicines (opiates) for pain. If you take more than one type at the same time, you may have more side effects. Give your health care provider a list of all medicines you use. Your doctor will tell you how much medicine to take. Do not take more medicine than directed. Call emergency for help if you have problems breathing. The medicine will cause constipation. Try to have a bowel movement at least every 2 to 3 days. If you do not have a bowel movement for 3 days, call your doctor or health care professional. Do not  take Tylenol (acetaminophen) or medicines that have acetaminophen with this medicine. Too much acetaminophen can be very dangerous. Many nonprescription medicines contain acetaminophen. Always read the labels carefully to avoid taking more acetaminophen. What side effects may I notice from receiving this medicine? Side effects that you should report to your doctor or health care professional as soon as possible: -allergic reactions like skin rash, itching or hives, swelling of the face, lips, or tongue -breathing difficulties, wheezing -confusion -light headedness or fainting spells -severe stomach pain -unusually weak or tired -yellowing of the skin or the whites of the eyes Side effects that usually do not require medical attention (report to your doctor or health care professional if they continue or are bothersome): -dizziness -drowsiness -nausea -vomiting This list may not describe all possible side effects. Call your doctor for medical advice about side effects. You may report side effects to FDA at 1-800-FDA-1088. Where should I keep my medicine? Keep out of the reach of children. This medicine can be abused. Keep your medicine in a safe place to protect it from theft. Do not share this medicine with anyone. Selling or giving away this medicine is dangerous and against the law. Store at room temperature between 20 and 25 degrees C (68 and 77 degrees F). Keep container tightly closed. Protect from light. This medicine may cause accidental overdose and death if it is taken by other adults, children, or pets. Flush any unused medicine down the toilet to reduce the chance of harm. Do not use the medicine after the expiration date. NOTE: This sheet is a summary. It may not cover all possible information. If you have questions about this medicine, talk to your doctor, pharmacist, or health care provider.  2015, [employer]vier/Gold Standard. (2012-11-25 13:17:35)

## 2013-12-24 NOTE — ED Notes (Signed)
Patient c/o RUQ pain since 1730; states it is his gallbladder.

## 2013-12-24 NOTE — ED Provider Notes (Signed)
CSN: [id number]     Arrival date & time 12/24/13  0007 History   First MD Initiated Contact with Patient 12/24/13 0120     Chief Complaint  Patient presents with  . Abdominal Pain     (Consider location/radiation/quality/duration/timing/severity/associated sxs/prior Treatment) Patient is a 43 y.o. male presenting with abdominal pain. The history is provided by the patient.  Abdominal Pain He had onset of right upper quadrant pain at about 6 PM. Pain is severe he rates it 10/10. It does radiate to his back. There is associated nausea and vomiting. Nothing makes pain better nothing, makes it worse. He was recently diagnosed with gallstones and was referred to a surgeon but has not seen the surgeon yet. He states he had not eaten anything for about 6 hours prior to onset of pain. There's been no fever, chills, sweats.  Past Medical History  Diagnosis Date  . Gastritis    History reviewed. No pertinent past surgical history. No family history on file. History  Substance Use Topics  . Smoking status: Current Every Day Smoker -- 1.00 packs/day  . Smokeless tobacco: Never Used  . Alcohol Use: No    Review of Systems  Gastrointestinal: Positive for abdominal pain.  All other systems reviewed and are negative.     Allergies  Review of patient's allergies indicates no known allergies.  Home Medications   Prior to Admission medications   Medication Sig Start Date End Date Taking? Authorizing Provider  Multiple Vitamin (MULTIVITAMIN WITH MINERALS) TABS tablet Take 1 tablet by mouth daily.   Yes Historical Provider, MD  omeprazole (PRILOSEC) 20 MG capsule Take 20 mg by mouth daily.   Yes Historical Provider, MD  ondansetron (ZOFRAN) 8 MG tablet Take 1 tablet (8 mg total) by mouth every 8 (eight) hours as needed for nausea or vomiting. 12/04/13  Yes Tammy L. Triplett, PA-C  oxyCODONE-acetaminophen (PERCOCET/ROXICET) 5-325 MG per tablet Take 1 tablet by mouth every 4 (four) hours as  needed. 12/04/13  Yes Tammy L. Triplett, PA-C   BP 141/74  Pulse 69  Temp(Src) 98.5 F (36.9 C) (Oral)  Resp 20  Ht  (1.753 m)  Wt 150 lb (68.04 kg)  BMI 22.14 kg/m2  SpO2 98% Physical Exam  Nursing note and vitals reviewed.  43 year old male, who is in obvious pain, but is in no acute distress. Vital signs are significant for borderline hypertension. Oxygen saturation is 98%, which is normal. Head is normocephalic and atraumatic. PERRLA, EOMI. Oropharynx is clear. Neck is nontender and supple without adenopathy or JVD. Back is nontender and there is no CVA tenderness. Lungs are clear without rales, wheezes, or rhonchi. Chest is nontender. Heart has regular rate and rhythm without murmur. Abdomen is soft, flat, with moderate to severe RUQ tenderness and a positive [employer]. There are no masses or hepatosplenomegaly and peristalsis is hypoactive. Extremities have no cyanosis or edema, full range of motion is present. Skin is warm and dry without rash. Neurologic: Mental status is normal, cranial nerves are intact, there are no motor or sensory deficits.   ED Course  Procedures (including critical care time) Labs Review Results for orders placed during the hospital encounter of 12/24/13  CBC WITH DIFFERENTIAL      Result Value Ref Range   WBC 12.8 (*) 4.0 - 10.5 K/uL   RBC 4.46  4.22 - 5.81 MIL/uL   Hemoglobin 14.1  13.0 - 17.0 g/dL   HCT 40.9  81.1 - 91.4 %  MCV 90.6  78.0 - 100.0 fL   MCH 31.6  26.0 - 34.0 pg   MCHC 34.9  30.0 - 36.0 g/dL   RDW 65.7  84.6 - 96.2 %   Platelets 246  150 - 400 K/uL   Neutrophils Relative % 73  43 - 77 %   Neutro Abs 9.4 (*) 1.7 - 7.7 K/uL   Lymphocytes Relative 19  12 - 46 %   Lymphs Abs 2.4  0.7 - 4.0 K/uL   Monocytes Relative 6  3 - 12 %   Monocytes Absolute 0.7  0.1 - 1.0 K/uL   Eosinophils Relative 2  0 - 5 %   Eosinophils Absolute 0.2  0.0 - 0.7 K/uL   Basophils Relative 0  0 - 1 %   Basophils Absolute 0.1  0.0 - 0.1 K/uL   COMPREHENSIVE METABOLIC PANEL      Result Value Ref Range   Sodium 139  137 - 147 mEq/L   Potassium 4.3  3.7 - 5.3 mEq/L   Chloride 102  96 - 112 mEq/L   CO2 27  19 - 32 mEq/L   Glucose, Bld 117 (*) 70 - 99 mg/dL   BUN 14  6 - 23 mg/dL   Creatinine, Ser 9.52  0.50 - 1.35 mg/dL   Calcium 9.4  8.4 - 84.1 mg/dL   Total Protein 7.0  6.0 - 8.3 g/dL   Albumin 3.9  3.5 - 5.2 g/dL   AST 14  0 - 37 U/L   ALT 13  0 - 53 U/L   Alkaline Phosphatase 97  39 - 117 U/L   Total Bilirubin <0.2 (*) 0.3 - 1.2 mg/dL   GFR calc non Af Amer >90  >90 mL/min   GFR calc Af Amer >90  >90 mL/min   Anion gap 10  5 - 15  LIPASE, BLOOD      Result Value Ref Range   Lipase 57  11 - 59 U/L   MDM   Final diagnoses:  Biliary colic    An upper quadrant pain consistent with biliary colic. Old records reviewed and he did have a recent CT scan which showed multiple small gallstones. Metabolic panel and lipase will be checked and he'll be given IV fluids, hydromorphone, ondansetron.  He feels much better after above noted treatment. Laboratory evaluation shows no elevation of transaminases, bilirubin, alkaline phosphatase, or lipase. He is discharged with prescriptions for metoclopramide and oxycodone and acetaminophen. He is referred to the Gen. surgery evaluation for elective cholecystectomy.  Dione Booze, MD 12/24/13 [phone]

## 2013-12-24 NOTE — ED Notes (Signed)
Pt states he has known gallstones but has not followed up with referral as he is waiting for his insurance to go through.

## 2013-12-29 MED FILL — Ondansetron HCl Tab 4 MG: ORAL | Qty: 4 | Status: AC

## 2013-12-29 MED FILL — Oxycodone w/ Acetaminophen Tab 5-325 MG: ORAL | Qty: 6 | Status: AC

## 2014-01-01 ENCOUNTER — Encounter (HOSPITAL_COMMUNITY): Payer: Self-pay | Admitting: Emergency Medicine

## 2014-01-01 ENCOUNTER — Emergency Department (HOSPITAL_COMMUNITY): Payer: BC Managed Care – PPO

## 2014-01-01 ENCOUNTER — Emergency Department (HOSPITAL_COMMUNITY)
Admission: EM | Admit: 2014-01-01 | Discharge: 2014-01-01 | Disposition: A | Discharge status: Home / Self Care | Payer: BC Managed Care – PPO | Attending: Emergency Medicine | Admitting: Emergency Medicine

## 2014-01-01 DIAGNOSIS — K8020 Calculus of gallbladder without cholecystitis without obstruction: Secondary | ICD-10-CM | POA: Insufficient documentation

## 2014-01-01 DIAGNOSIS — F17200 Nicotine dependence, unspecified, uncomplicated: Secondary | ICD-10-CM | POA: Insufficient documentation

## 2014-01-01 DIAGNOSIS — R109 Unspecified abdominal pain: Secondary | ICD-10-CM

## 2014-01-01 DIAGNOSIS — Z79899 Other long term (current) drug therapy: Secondary | ICD-10-CM | POA: Insufficient documentation

## 2014-01-01 DIAGNOSIS — R1011 Right upper quadrant pain: Secondary | ICD-10-CM | POA: Insufficient documentation

## 2014-01-01 DIAGNOSIS — G8929 Other chronic pain: Secondary | ICD-10-CM | POA: Insufficient documentation

## 2014-01-01 HISTORY — DX: Calculus of gallbladder without cholecystitis without obstruction: K80.20

## 2014-01-01 LAB — CBC WITH DIFFERENTIAL/PLATELET
BASOS ABS: 0 10*3/uL (ref 0.0–0.1)
Basophils Relative: 0 % (ref 0–1)
Eosinophils Absolute: 0.3 10*3/uL (ref 0.0–0.7)
Eosinophils Relative: 3 % (ref 0–5)
HCT: 41.4 % (ref 39.0–52.0)
HEMOGLOBIN: 14.5 g/dL (ref 13.0–17.0)
Lymphocytes Relative: 29 % (ref 12–46)
Lymphs Abs: 2.8 10*3/uL (ref 0.7–4.0)
MCH: 31.6 pg (ref 26.0–34.0)
MCHC: 35 g/dL (ref 30.0–36.0)
MCV: 90.2 fL (ref 78.0–100.0)
MONOS PCT: 6 % (ref 3–12)
Monocytes Absolute: 0.6 10*3/uL (ref 0.1–1.0)
Neutro Abs: 6 10*3/uL (ref 1.7–7.7)
Neutrophils Relative %: 62 % (ref 43–77)
Platelets: 245 10*3/uL (ref 150–400)
RBC: 4.59 MIL/uL (ref 4.22–5.81)
RDW: 12.5 % (ref 11.5–15.5)
WBC: 9.7 10*3/uL (ref 4.0–10.5)

## 2014-01-01 LAB — COMPREHENSIVE METABOLIC PANEL
ALBUMIN: 3.8 g/dL (ref 3.5–5.2)
ALT: 11 U/L (ref 0–53)
AST: 13 U/L (ref 0–37)
Alkaline Phosphatase: 94 U/L (ref 39–117)
Anion gap: 8 (ref 5–15)
BILIRUBIN TOTAL: 0.2 mg/dL — AB (ref 0.3–1.2)
BUN: 18 mg/dL (ref 6–23)
CHLORIDE: 103 meq/L (ref 96–112)
CO2: 29 mEq/L (ref 19–32)
Calcium: 9.2 mg/dL (ref 8.4–10.5)
Creatinine, Ser: 0.93 mg/dL (ref 0.50–1.35)
GFR calc Af Amer: >90 mL/min (ref >90)
GFR calc non Af Amer: >90 mL/min (ref >90)
Glucose, Bld: 98 mg/dL (ref 70–99)
Potassium: 4.2 mEq/L (ref 3.7–5.3)
Sodium: 140 mEq/L (ref 137–147)
Total Protein: 7.1 g/dL (ref 6.0–8.3)

## 2014-01-01 LAB — LIPASE, BLOOD: Lipase: 34 U/L (ref 11–59)

## 2014-01-01 MED ORDER — SODIUM CHLORIDE 0.9 % IV SOLN
INTRAVENOUS | Status: DC
  Administered 2014-01-01: 06:00:00 via INTRAVENOUS

## 2014-01-01 MED ORDER — FAMOTIDINE IN NACL 20-0.9 MG/50ML-% IV SOLN
20.0000 mg | Freq: Once | INTRAVENOUS | Status: AC
  Administered 2014-01-01: 20 mg via INTRAVENOUS
  Filled 2014-01-01: qty 50

## 2014-01-01 MED ORDER — MORPHINE SULFATE 4 MG/ML IJ SOLN
4.0000 mg | INTRAMUSCULAR | Status: DC | PRN
  Administered 2014-01-01: 4 mg via INTRAVENOUS
  Filled 2014-01-01: qty 1

## 2014-01-01 MED ORDER — ONDANSETRON HCL 4 MG/2ML IJ SOLN
4.0000 mg | INTRAMUSCULAR | Status: DC | PRN
  Administered 2014-01-01: 4 mg via INTRAVENOUS
  Filled 2014-01-01: qty 2

## 2014-01-01 NOTE — ED Notes (Addendum)
Pt reporting pain in RUQ, radiating to back.  Reports pain started about 1:30 this morning.  States that he has problems with his gallbladder, and the pain is similar.  Pt was seen and treated on 9/9 for same. Reports he has been taking 2 percocet per day with no relief.

## 2014-01-01 NOTE — Discharge Instructions (Signed)
If you were given medicines take as directed.  If you are on coumadin or contraceptives realize their levels and effectiveness is altered by many different medicines.  If you have any reaction (rash, tongues swelling, other) to the medicines stop taking and see a physician.   Please follow up as directed and return to the ER or see a physician for new or worsening symptoms.  Thank you. Filed Vitals:   01/01/14 0543  BP: 107/74  Pulse: 55  Temp: 97.7 F (36.5 C)  TempSrc: Oral  Resp: 18  Height:  (1.778 m)  Weight: 150 lb (68.04 kg)  SpO2: 99%    Biliary Colic  Biliary colic is a steady or irregular pain in the upper abdomen. It is usually under the right side of the rib cage. It happens when gallstones interfere with the normal flow of bile from the gallbladder. Bile is a liquid that helps to digest fats. Bile is made in the liver and stored in the gallbladder. When you eat a meal, bile passes from the gallbladder through the cystic duct and the common bile duct into the small intestine. There, it mixes with partially digested food. If a gallstone blocks either of these ducts, the normal flow of bile is blocked. The muscle cells in the bile duct contract forcefully to try to move the stone. This causes the pain of biliary colic.  SYMPTOMS   A person with biliary colic usually complains of pain in the upper abdomen. This pain can be:  In the center of the upper abdomen just below the breastbone.  In the upper-right part of the abdomen, near the gallbladder and liver.  Spread back toward the right shoulder blade.  Nausea and vomiting.  The pain usually occurs after eating.  Biliary colic is usually triggered by the digestive system's demand for bile. The demand for bile is high after fatty meals. Symptoms can also occur when a person who has been fasting suddenly eats a very large meal. Most episodes of biliary colic pass after 1 to 5 hours. After the most intense pain passes, your  abdomen may continue to ache mildly for about 24 hours. DIAGNOSIS  After you describe your symptoms, your caregiver will perform a physical exam. He or she will pay attention to the upper right portion of your belly (abdomen). This is the area of your liver and gallbladder. An ultrasound will help your caregiver look for gallstones. Specialized scans of the gallbladder may also be done. Blood tests may be done, especially if you have fever or if your pain persists. PREVENTION  Biliary colic can be prevented by controlling the risk factors for gallstones. Some of these risk factors, such as heredity, increasing age, and pregnancy are a normal part of life. Obesity and a high-fat diet are risk factors you can change through a healthy lifestyle. Women going through menopause who take hormone replacement therapy (estrogen) are also more likely to develop biliary colic. TREATMENT   Pain medication may be prescribed.  You may be encouraged to eat a fat-free diet.  If the first episode of biliary colic is severe, or episodes of colic keep retuning, surgery to remove the gallbladder (cholecystectomy) is usually recommended. This procedure can be done through small incisions using an instrument called a laparoscope. The procedure often requires a brief stay in the hospital. Some people can leave the hospital the same day. It is the most widely used treatment in people troubled by painful gallstones. It is effective  and safe, with no complications in more than 90% of cases.  If surgery cannot be done, medication that dissolves gallstones may be used. This medication is expensive and can take months or years to work. Only small stones will dissolve.  Rarely, medication to dissolve gallstones is combined with a procedure called shock-wave lithotripsy. This procedure uses carefully aimed shock waves to break up gallstones. In many people treated with this procedure, gallstones form again within a few  years. PROGNOSIS  If gallstones block your cystic duct or common bile duct, you are at risk for repeated episodes of biliary colic. There is also a 25% chance that you will develop a gallbladder infection(acute cholecystitis), or some other complication of gallstones within 10 to 20 years. If you have surgery, schedule it at a time that is convenient for you and at a time when you are not sick. HOME CARE INSTRUCTIONS   Drink plenty of clear fluids.  Avoid fatty, greasy or fried foods, or any foods that make your pain worse.  Take medications as directed. SEEK MEDICAL CARE IF:   You develop a fever over 100.5 F (38.1 C).  Your pain gets worse over time.  You develop nausea that prevents you from eating and drinking.  You develop vomiting. SEEK IMMEDIATE MEDICAL CARE IF:   You have continuous or severe belly (abdominal) pain which is not relieved with medications.  You develop nausea and vomiting which is not relieved with medications.  You have symptoms of biliary colic and you suddenly develop a fever and shaking chills. This may signal cholecystitis. Call your caregiver immediately.  You develop a yellow color to your skin or the white part of your eyes (jaundice). Document Released: 09/04/2005 Document Revised: 06/26/2011 Document Reviewed: 11/14/2007 [facility] Patient Information 2015 [place], [place]. This information is not intended to replace advice given to you by your health care provider. Make sure you discuss any questions you have with your health care provider.

## 2014-01-01 NOTE — ED Notes (Signed)
In to update pt about pain meds but pt asleep. Did not awaken upon entrance to room.

## 2014-01-01 NOTE — ED Provider Notes (Signed)
CSN: [id number]     Arrival date & time 01/01/14  8119 History   First MD Initiated Contact with Patient 01/01/14 [phone]     Chief Complaint  Patient presents with  . Abdominal Pain      HPI Pt was seen at 0550.  Per pt, c/o gradual onset and persistence of constant acute flair of his chronic upper abd "pain" since 0130 PTA. Has been associated with nausea. Describes the abd pain as "aching," and "my gallbladder is acting up again." States he has had this similar pain intermittently for the past 9 months. Pt has been taking percocet without relief.   Denies vomiting/diarrhea, no fevers, no back pain, no rash, no CP/SOB, no black or blood in stools. The symptoms have been associated with no other complaints. The patient has a significant history of similar symptoms previously, recently being evaluated for this complaint and multiple prior evals for same.  Pt has not f/u with General Surgeon as previously instructed.     Past Medical History  Diagnosis Date  . Gastritis   . Gallstones    History reviewed. No pertinent past surgical history.  History  Substance Use Topics  . Smoking status: Current Every Day Smoker -- 1.00 packs/day  . Smokeless tobacco: Never Used  . Alcohol Use: No    Review of Systems ROS: Statement: All systems negative except as marked or noted in the HPI; Constitutional: Negative for fever and chills. ; ; Eyes: Negative for eye pain, redness and discharge. ; ; ENMT: Negative for ear pain, hoarseness, nasal congestion, sinus pressure and sore throat. ; ; Cardiovascular: Negative for chest pain, palpitations, diaphoresis, dyspnea and peripheral edema. ; ; Respiratory: Negative for cough, wheezing and stridor. ; ; Gastrointestinal: +abd pain, nausea. Negative for vomiting, diarrhea, blood in stool, hematemesis, jaundice and rectal bleeding. . ; ; Genitourinary: Negative for dysuria, flank pain and hematuria. ; ; Musculoskeletal: Negative for back pain and neck pain.  Negative for swelling and trauma.; ; Skin: Negative for pruritus, rash, abrasions, blisters, bruising and skin lesion.; ; Neuro: Negative for headache, lightheadedness and neck stiffness. Negative for weakness, altered level of consciousness , altered mental status, extremity weakness, paresthesias, involuntary movement, seizure and syncope.     Allergies  Review of patient's allergies indicates no known allergies.  Home Medications   Prior to Admission medications   Medication Sig Start Date End Date Taking? Authorizing Provider  metoCLOPramide (REGLAN) 10 MG tablet Take 1 tablet (10 mg total) by mouth every 6 (six) hours as needed for nausea. 12/24/13   Dione Booze, MD  Multiple Vitamin (MULTIVITAMIN WITH MINERALS) TABS tablet Take 1 tablet by mouth daily.    Historical Provider, MD  omeprazole (PRILOSEC) 20 MG capsule Take 20 mg by mouth daily.    Historical Provider, MD  ondansetron (ZOFRAN) 4 MG tablet Take 1 tablet (4 mg total) by mouth every 6 (six) hours as needed for nausea or vomiting. 12/24/13   Dione Booze, MD  ondansetron (ZOFRAN) 8 MG tablet Take 1 tablet (8 mg total) by mouth every 8 (eight) hours as needed for nausea or vomiting. 12/04/13   Tammy L. Triplett, PA-C  oxyCODONE-acetaminophen (PERCOCET/ROXICET) 5-325 MG per tablet Take 1 tablet by mouth every 4 (four) hours as needed. 12/04/13   Tammy L. Triplett, PA-C  oxyCODONE-acetaminophen (PERCOCET/ROXICET) 5-325 MG per tablet Take 1 tablet by mouth every 4 (four) hours as needed. 12/24/13   Dione Booze, MD  oxyCODONE-acetaminophen (PERCOCET/ROXICET) 5-325 MG per tablet Take 1  tablet by mouth every 4 (four) hours as needed. 12/24/13   Dione Booze, MD   BP 107/74  Pulse 55  Temp(Src) 97.7 F (36.5 C) (Oral)  Resp 18  Ht  (1.778 m)  Wt 150 lb (68.04 kg)  BMI 21.52 kg/m2  SpO2 99% Physical Exam 0555: Physical examination:  Nursing notes reviewed; Vital signs and O2 SAT reviewed;  Constitutional: Well developed, Well nourished,  Well hydrated, Uncomfortable appearing.; Head:  Normocephalic, atraumatic; Eyes: EOMI, PERRL, No scleral icterus; ENMT: Mouth and pharynx normal, Mucous membranes moist; Neck: Supple, Full range of motion, No lymphadenopathy; Cardiovascular: Regular rate and rhythm, No murmur, rub, or gallop; Respiratory: Breath sounds clear & equal bilaterally, No rales, rhonchi, wheezes.  Speaking full sentences with ease, Normal respiratory effort/excursion; Chest: Nontender, Movement normal; Abdomen: Soft, +RUQ and mid-epigastric tenderness to palp. No rebound or guarding. Nondistended, Normal bowel sounds; Genitourinary: No CVA tenderness; Extremities: Pulses normal, No tenderness, No edema, No calf edema or asymmetry.; Neuro: AA&Ox3, Major CN grossly intact.  Speech clear. No gross focal motor or sensory deficits in extremities. Climbs on and off stretcher easily by himself. Gait steady.; Skin: Color normal, Warm, Dry.   ED Course  Procedures     MDM  MDM Reviewed: previous chart, nursing note and vitals Reviewed previous: labs, CT scan and ultrasound Interpretation: labs, ultrasound and x-ray    Results for orders placed during the hospital encounter of 01/01/14  CBC WITH DIFFERENTIAL      Result Value Ref Range   WBC 9.7  4.0 - 10.5 K/uL   RBC 4.59  4.22 - 5.81 MIL/uL   Hemoglobin 14.5  13.0 - 17.0 g/dL   HCT 16.1  09.6 - 04.5 %   MCV 90.2  78.0 - 100.0 fL   MCH 31.6  26.0 - 34.0 pg   MCHC 35.0  30.0 - 36.0 g/dL   RDW 40.9  81.1 - 91.4 %   Platelets 245  150 - 400 K/uL   Neutrophils Relative % 62  43 - 77 %   Neutro Abs 6.0  1.7 - 7.7 K/uL   Lymphocytes Relative 29  12 - 46 %   Lymphs Abs 2.8  0.7 - 4.0 K/uL   Monocytes Relative 6  3 - 12 %   Monocytes Absolute 0.6  0.1 - 1.0 K/uL   Eosinophils Relative 3  0 - 5 %   Eosinophils Absolute 0.3  0.0 - 0.7 K/uL   Basophils Relative 0  0 - 1 %   Basophils Absolute 0.0  0.0 - 0.1 K/uL  COMPREHENSIVE METABOLIC PANEL      Result Value Ref Range    Sodium 140  137 - 147 mEq/L   Potassium 4.2  3.7 - 5.3 mEq/L   Chloride 103  96 - 112 mEq/L   CO2 29  19 - 32 mEq/L   Glucose, Bld 98  70 - 99 mg/dL   BUN 18  6 - 23 mg/dL   Creatinine, Ser 7.82  0.50 - 1.35 mg/dL   Calcium 9.2  8.4 - 95.6 mg/dL   Total Protein 7.1  6.0 - 8.3 g/dL   Albumin 3.8  3.5 - 5.2 g/dL   AST 13  0 - 37 U/L   ALT 11  0 - 53 U/L   Alkaline Phosphatase 94  39 - 117 U/L   Total Bilirubin 0.2 (*) 0.3 - 1.2 mg/dL   GFR calc non Af Amer >90  >90 mL/min  GFR calc Af Amer >90  >90 mL/min   Anion gap 8  5 - 15  LIPASE, BLOOD      Result Value Ref Range   Lipase 34  11 - 59 U/L   Ct Abdomen Pelvis W Contrast 12/04/2013   CLINICAL DATA:  Right upper quadrant pain, nausea  EXAM: CT ABDOMEN AND PELVIS WITH CONTRAST  TECHNIQUE: Multidetector CT imaging of the abdomen and pelvis was performed using the standard protocol following bolus administration of intravenous contrast.  CONTRAST:  25mL OMNIPAQUE IOHEXOL 300 MG/ML SOLN, OMNIPAQUE IOHEXOL 300 MG/ML SOLN  COMPARISON:  None.  FINDINGS: Sagittal images of the spine shows multilevel degenerative changes thoracolumbar spine. There is Schmorl's node deformity upper endplate of L4 vertebral body. Disc space flattening with mild posterior spurring and endplate sclerotic changes at L1-L2 and L4-L5 level.  The lung bases are unremarkable.  The liver shows no biliary ductal dilatation. There is a hypervascular lesion in right hepatic lobe anterior to the gallbladder measures about 2.5 cm. This may represent focal nodular hyperplasia, atypical hemangioma or fatty sparing. Further correlation with MRI is recommended as clinically warranted.  The gallbladder shows small layering calcified gallstones. No pericholecystic fluid.  Moderate gastric distension with contrast. No gastric outlet obstruction is suggested.  The pancreas, spleen and adrenal glands are unremarkable. Kidneys are symmetrical in size and enhancement. No hydronephrosis  or hydroureter.  Delayed renal images shows bilateral renal symmetrical excretion. Bilateral visualized proximal ureter is unremarkable.  No aortic aneurysm.  No small bowel obstruction.  No ascites or free air.  No adenopathy.  There is no pericecal inflammation. The terminal ileum is unremarkable. There is a retrocecal appendix with normal appearance. The tip of the appendix is going cephalad adjacent to inferior aspect of the right hepatic lobe.  Prostate gland and seminal vesicles are unremarkable. No distal colonic obstruction. There is a left inguinal scrotal canal hernia containing fat measures 1.5 cm. No evidence of acute complication.  IMPRESSION: 1. There is hypervascular lesion in right hepatic lobe anterior to the gallbladder measures 0.5 cm. Further correlation with MRI is recommended as clinically warranted. 2. Small layering calcified gallstones are noted within gallbladder. 3. Normal appendix.  No pericecal inflammation. 4. No hydronephrosis or hydroureter. 5. No small bowel obstruction.   Electronically Signed   By: Natasha Mead M.D.   On: 12/04/2013 10:22    0705:  [place] pending. Sign out to Dr. Jodi Mourning.   Samuel Jester, DO 01/01/14 [phone]

## 2014-01-01 NOTE — ED Provider Notes (Signed)
Patient signed out with plan to followup ultrasound of gallbladder with likely plan for outpatient followup. Patient has not followed up with surgery as previously recommended. Blood work reviewed unremarkable. Pain controlled. Ultrasound results reviewed gallstones as seen prior and no signs of cholecystitis. Followup with general surgery.  Labs Reviewed  COMPREHENSIVE METABOLIC PANEL - Abnormal; Notable for the following:    Total Bilirubin 0.2 (*)    All other components within normal limits  CBC WITH DIFFERENTIAL  LIPASE, BLOOD  Dg Chest 2 View  01/01/2014   CLINICAL DATA:  Right upper quadrant abdominal pain, chest pain and cough. History of smoking.  EXAM: CHEST  2 VIEW  COMPARISON:  Thoracic spine radiograph from 07/23/2007  FINDINGS: The lungs are well-aerated and clear. There is no evidence of focal opacification, pleural effusion or pneumothorax.  The heart is normal in size; the mediastinal contour is within normal limits. No acute osseous abnormalities are seen.  IMPRESSION: No acute cardiopulmonary process seen.   Electronically Signed   By: Roanna Raider M.D.   On: 01/01/2014 06:47   Ct Abdomen Pelvis W Contrast  12/04/2013   CLINICAL DATA:  Right upper quadrant pain, nausea  EXAM: CT ABDOMEN AND PELVIS WITH CONTRAST  TECHNIQUE: Multidetector CT imaging of the abdomen and pelvis was performed using the standard protocol following bolus administration of intravenous contrast.  CONTRAST:  25mL OMNIPAQUE IOHEXOL 300 MG/ML SOLN, OMNIPAQUE IOHEXOL 300 MG/ML SOLN  COMPARISON:  None.  FINDINGS: Sagittal images of the spine shows multilevel degenerative changes thoracolumbar spine. There is Schmorl's node deformity upper endplate of L4 vertebral body. Disc space flattening with mild posterior spurring and endplate sclerotic changes at L1-L2 and L4-L5 level.  The lung bases are unremarkable.  The liver shows no biliary ductal dilatation. There is a hypervascular lesion in right hepatic lobe  anterior to the gallbladder measures about 2.5 cm. This may represent focal nodular hyperplasia, atypical hemangioma or fatty sparing. Further correlation with MRI is recommended as clinically warranted.  The gallbladder shows small layering calcified gallstones. No pericholecystic fluid.  Moderate gastric distension with contrast. No gastric outlet obstruction is suggested.  The pancreas, spleen and adrenal glands are unremarkable. Kidneys are symmetrical in size and enhancement. No hydronephrosis or hydroureter.  Delayed renal images shows bilateral renal symmetrical excretion. Bilateral visualized proximal ureter is unremarkable.  No aortic aneurysm.  No small bowel obstruction.  No ascites or free air.  No adenopathy.  There is no pericecal inflammation. The terminal ileum is unremarkable. There is a retrocecal appendix with normal appearance. The tip of the appendix is going cephalad adjacent to inferior aspect of the right hepatic lobe.  Prostate gland and seminal vesicles are unremarkable. No distal colonic obstruction. There is a left inguinal scrotal canal hernia containing fat measures 1.5 cm. No evidence of acute complication.  IMPRESSION: 1. There is hypervascular lesion in right hepatic lobe anterior to the gallbladder measures 0.5 cm. Further correlation with MRI is recommended as clinically warranted. 2. Small layering calcified gallstones are noted within gallbladder. 3. Normal appendix.  No pericecal inflammation. 4. No hydronephrosis or hydroureter. 5. No small bowel obstruction.   Electronically Signed   By: Natasha Mead M.D.   On: 12/04/2013 10:22   US Abdomen Limited Ruq  01/01/2014   CLINICAL DATA:  Right upper quadrant pain, known gallstones  EXAM: US ABDOMEN LIMITED - RIGHT UPPER QUADRANT  COMPARISON:  None.  FINDINGS: Gallbladder:  Suspected sludge and tiny nonshadowing gallstones. Gallbladder is underdistended  with mild gallbladder wall thickening, measuring 4.5 mm. Negative sonographic  Murphy's sign.  Common bile duct:  Diameter: 6 mm, at the upper limits of normal.  Liver:  No focal lesion identified. Within normal limits in parenchymal echogenicity.  IMPRESSION: Suspected sludge and tiny nonshadowing gallstones, but without convincing findings suggest acute cholecystitis. Negative sonographic Murphy's sign.   Electronically Signed   By: Charline Bills M.D.   On: 01/01/2014 10:46    Filed Vitals:   01/01/14 0543  BP: 107/74  Pulse: 55  Temp: 97.7 F (36.5 C)  TempSrc: Oral  Resp: 18  Height:  (1.778 m)  Weight: 150 lb (68.04 kg)  SpO2: 99%    Cholelithiasis, chronic abdominal pain  Enid Skeens, MD 01/01/14 1123

## 2014-01-05 ENCOUNTER — Emergency Department (HOSPITAL_COMMUNITY): Payer: BC Managed Care – PPO

## 2014-01-05 ENCOUNTER — Encounter (HOSPITAL_COMMUNITY): Payer: Self-pay | Admitting: Emergency Medicine

## 2014-01-05 ENCOUNTER — Emergency Department (HOSPITAL_COMMUNITY)
Admission: EM | Admit: 2014-01-05 | Discharge: 2014-01-05 | Disposition: A | Discharge status: Home / Self Care | Payer: BC Managed Care – PPO | Attending: Emergency Medicine | Admitting: Emergency Medicine

## 2014-01-05 DIAGNOSIS — K8020 Calculus of gallbladder without cholecystitis without obstruction: Secondary | ICD-10-CM | POA: Insufficient documentation

## 2014-01-05 DIAGNOSIS — Z79899 Other long term (current) drug therapy: Secondary | ICD-10-CM | POA: Insufficient documentation

## 2014-01-05 DIAGNOSIS — F17200 Nicotine dependence, unspecified, uncomplicated: Secondary | ICD-10-CM | POA: Insufficient documentation

## 2014-01-05 DIAGNOSIS — R112 Nausea with vomiting, unspecified: Secondary | ICD-10-CM | POA: Insufficient documentation

## 2014-01-05 DIAGNOSIS — M545 Low back pain, unspecified: Secondary | ICD-10-CM | POA: Insufficient documentation

## 2014-01-05 DIAGNOSIS — K8050 Calculus of bile duct without cholangitis or cholecystitis without obstruction: Secondary | ICD-10-CM

## 2014-01-05 LAB — CBC WITH DIFFERENTIAL/PLATELET
Basophils Absolute: 0 10*3/uL (ref 0.0–0.1)
Basophils Relative: 0 % (ref 0–1)
Eosinophils Absolute: 0.3 10*3/uL (ref 0.0–0.7)
Eosinophils Relative: 2 % (ref 0–5)
HCT: 44.1 % (ref 39.0–52.0)
Hemoglobin: 15.3 g/dL (ref 13.0–17.0)
LYMPHS PCT: 24 % (ref 12–46)
Lymphs Abs: 3 10*3/uL (ref 0.7–4.0)
MCH: 31.6 pg (ref 26.0–34.0)
MCHC: 34.7 g/dL (ref 30.0–36.0)
MCV: 91.1 fL (ref 78.0–100.0)
MONO ABS: 0.7 10*3/uL (ref 0.1–1.0)
Monocytes Relative: 5 % (ref 3–12)
NEUTROS ABS: 8.5 10*3/uL — AB (ref 1.7–7.7)
Neutrophils Relative %: 69 % (ref 43–77)
PLATELETS: 292 10*3/uL (ref 150–400)
RBC: 4.84 MIL/uL (ref 4.22–5.81)
RDW: 12.7 % (ref 11.5–15.5)
WBC: 12.5 10*3/uL — AB (ref 4.0–10.5)

## 2014-01-05 LAB — COMPREHENSIVE METABOLIC PANEL
ALK PHOS: 105 U/L (ref 39–117)
ALT: 12 U/L (ref 0–53)
AST: 14 U/L (ref 0–37)
Albumin: 3.7 g/dL (ref 3.5–5.2)
Anion gap: 10 (ref 5–15)
BILIRUBIN TOTAL: 0.2 mg/dL — AB (ref 0.3–1.2)
BUN: 16 mg/dL (ref 6–23)
CALCIUM: 9.3 mg/dL (ref 8.4–10.5)
CHLORIDE: 100 meq/L (ref 96–112)
CO2: 28 meq/L (ref 19–32)
Creatinine, Ser: 0.88 mg/dL (ref 0.50–1.35)
GLUCOSE: 117 mg/dL — AB (ref 70–99)
Potassium: 4.3 mEq/L (ref 3.7–5.3)
SODIUM: 138 meq/L (ref 137–147)
Total Protein: 7.2 g/dL (ref 6.0–8.3)

## 2014-01-05 LAB — LIPASE, BLOOD: Lipase: 35 U/L (ref 11–59)

## 2014-01-05 LAB — TROPONIN I

## 2014-01-05 MED ORDER — HYDROMORPHONE HCL 1 MG/ML IJ SOLN
1.0000 mg | Freq: Once | INTRAMUSCULAR | Status: AC
  Administered 2014-01-05: 1 mg via INTRAVENOUS
  Filled 2014-01-05: qty 1

## 2014-01-05 MED ORDER — OXYCODONE-ACETAMINOPHEN 5-325 MG PO TABS: 2.0000 | ORAL_TABLET | ORAL | Status: DC | PRN

## 2014-01-05 MED ORDER — ONDANSETRON HCL 4 MG/2ML IJ SOLN
4.0000 mg | Freq: Once | INTRAMUSCULAR | Status: AC
  Administered 2014-01-05: 4 mg via INTRAVENOUS
  Filled 2014-01-05: qty 2

## 2014-01-05 MED ORDER — SODIUM CHLORIDE 0.9 % IV BOLUS (SEPSIS)
1000.0000 mL | Freq: Once | INTRAVENOUS | Status: AC
  Administered 2014-01-05: 1000 mL via INTRAVENOUS

## 2014-01-05 MED ORDER — ONDANSETRON HCL 4 MG PO TABS: 4.0000 mg | ORAL_TABLET | Freq: Four times a day (QID) | ORAL | Status: DC

## 2014-01-05 NOTE — ED Provider Notes (Signed)
CSN: [id number]     Arrival date & time 01/05/14  1309 History  This chart was scribed for Glynn Octave, MD by Littie Deeds, ED Scribe. This patient was seen in room APA12/APA12 and the patient's care was started at 1:59 PM.     Chief Complaint  Patient presents with  . Emesis    The history is provided by the patient. No language interpreter was used.   HPI Comments: Eric Flores is a 43 y.o. male with a hx of gallstones and gastritis who presents to the Emergency Department complaining of intermittent episodes of vomiting that initially began 2 months ago. He reports having 3 episodes of vomiting today without hematemesis. He notes associated RUQ pain, diarrhea, myalgias to his back. He has been referred to Dr. Lovell Sheehan for gallstone related problems but has not been able to schedule an appointment. He has had 3 prior visits for the same symptoms; most recent visit being 01/01/14. He has been taking Zofran, Percocet, and Prilosec with minimal relief. Patient denies SOB, CP, dysuria, hematuria, testicular pain, and fever. He says that symptoms are similar to gallbladder related pain in the past. He has no hx of previous abdominal surgeries. He denies regular EtOH use, but he is a 1ppd smoker. Patient states that pain does not get worse with drinking. He has NKDA.  Past Medical History  Diagnosis Date  . Gastritis   . Gallstones    History reviewed. No pertinent past surgical history. No family history on file. History  Substance Use Topics  . Smoking status: Current Every Day Smoker -- 1.00 packs/day  . Smokeless tobacco: Never Used  . Alcohol Use: No    Review of Systems A complete 10 system review of systems was obtained and all systems are negative except as noted in the HPI and PMH.   Allergies  Review of patient's allergies indicates no known allergies.  Home Medications   Prior to Admission medications   Medication Sig Start Date End Date Taking? Authorizing Provider   omeprazole (PRILOSEC) 20 MG capsule Take 20 mg by mouth daily.   Yes Historical Provider, MD  ondansetron (ZOFRAN) 4 MG tablet Take 1 tablet (4 mg total) by mouth every 6 (six) hours as needed for nausea or vomiting. 12/24/13  Yes Dione Booze, MD  ondansetron (ZOFRAN) 4 MG tablet Take 1 tablet (4 mg total) by mouth every 6 (six) hours. 01/05/14   Glynn Octave, MD  oxyCODONE-acetaminophen (PERCOCET/ROXICET) 5-325 MG per tablet Take 2 tablets by mouth every 4 (four) hours as needed for severe pain. 01/05/14   Glynn Octave, MD   BP 102/64  Pulse 86  Temp(Src) 98.5 F (36.9 C)  Resp 18  Ht  (1.727 m)  Wt 165 lb (74.844 kg)  BMI 25.09 kg/m2  SpO2 96% Physical Exam  Nursing note and vitals reviewed. Constitutional: He is oriented to person, place, and time. He appears well-developed and well-nourished. No distress.  HENT:  Head: Normocephalic and atraumatic.  Mouth/Throat: Oropharynx is clear and moist. No oropharyngeal exudate.  Eyes: Conjunctivae and EOM are normal. Pupils are equal, round, and reactive to light.  Neck: Normal range of motion. Neck supple.  No meningismus.  Cardiovascular: Normal rate, regular rhythm, normal heart sounds and intact distal pulses.   No murmur heard. Pulmonary/Chest: Effort normal and breath sounds normal. No respiratory distress.  Abdominal: Soft. There is tenderness. There is guarding. There is no rebound.  RUQ and epigastric tenderness with guarding.   Musculoskeletal: Normal  range of motion. He exhibits tenderness. He exhibits no edema.  Paraspinal lumbar tenderness   Neurological: He is alert and oriented to person, place, and time. No cranial nerve deficit. He exhibits normal muscle tone. Coordination normal.  No ataxia on finger to nose bilaterally. No pronator drift. 5/5 strength throughout. CN 2-12 intact. Negative Romberg. Equal grip strength. Sensation intact. Gait is normal.   Skin: Skin is warm.  Psychiatric: He has a normal mood and  affect. His behavior is normal.    ED Course  Procedures  DIAGNOSTIC STUDIES: Oxygen Saturation is 96% on RA, adequate by my interpretation.    COORDINATION OF CARE: 2:05 PM-Discussed treatment plan which includes labs, EKG, and US abdomen with pt at bedside and pt agreed to plan.   Labs Review Labs Reviewed  CBC WITH DIFFERENTIAL - Abnormal; Notable for the following:    WBC 12.5 (*)    Neutro Abs 8.5 (*)    All other components within normal limits  COMPREHENSIVE METABOLIC PANEL - Abnormal; Notable for the following:    Glucose, Bld 117 (*)    Total Bilirubin 0.2 (*)    All other components within normal limits  LIPASE, BLOOD  TROPONIN I    Imaging Review US Abdomen Limited Ruq  01/05/2014   CLINICAL DATA:  Right upper quadrant pain, gallstones  EXAM: US ABDOMEN LIMITED - RIGHT UPPER QUADRANT  COMPARISON:  01/01/2014  FINDINGS: Gallbladder:  Sludge in the gallbladder fundus. Additional tiny nonshadowing gallstones are suspected. Mild irregular gallbladder wall thickening, measuring 4 mm. No gallbladder distention or pericholecystic fluid. Negative sonographic Murphy sign.  Common bile duct:  Diameter: 6 mm, at the upper limits of normal.  Liver:  No focal lesion identified. Within normal limits in parenchymal echogenicity.  IMPRESSION: Cholelithiasis with mild gallbladder wall thickening.  No associated sonographic findings to suggest acute cholecystitis.  If there is continued clinical concern, consider hepatobiliary nuclear medicine scan.   Electronically Signed   By: Charline Bills M.D.   On: 01/05/2014 15:08     EKG Interpretation   Date/Time:  Monday January 05 2014 14:21:35 EDT Ventricular Rate:  65 PR Interval:  158 QRS Duration: 91 QT Interval:  400 QTC Calculation: 416 R Axis:   82 Text Interpretation:  Sinus rhythm No significant change was found  Confirmed by Manus Gunning  MD, Markees Carns [phone]) on 01/05/2014 2:36:39 PM      MDM   Final diagnoses:  Biliary  colic   3 day history of right upper quadrant pain, nausea and vomiting onset 3 days ago. Similar visits for same. He has not yet seen surgery. No chest pain or shortness of breath.  Abdomen soft without peritoneal signs. Ultrasound shows cholelithiasis without cholecystitis. Mild gallbladder wall thickening. LFTs and lipase normal. White blood cell count 12.  Discussed with Dr. Lovell Sheehan as this is the patient's fifth visit for biliary colic. Patient states he recently got insurance. Dr. Lovell Sheehan says he needs to bring proof insurance to his office and he'll be happy to see patient. He does not recommend HIDA scan today.   Patient able to tolerate PO. Medications will be refilled.  He is stable for follow up with Dr. Lovell Sheehan.  I personally performed the services described in this documentation, which was scribed in my presence. The recorded information has been reviewed and is accurate.   Glynn Octave, MD 01/05/14 2158

## 2014-01-05 NOTE — ED Notes (Signed)
Pt given water, sipping and tolerating well.  

## 2014-01-05 NOTE — Discharge Instructions (Signed)
Biliary Colic  Follow up with Dr. Lovell Sheehan for your gallbladder removal. Return to the ED if you develop new or worsening symptoms. Biliary colic is a steady or irregular pain in the upper abdomen. It is usually under the right side of the rib cage. It happens when gallstones interfere with the normal flow of bile from the gallbladder. Bile is a liquid that helps to digest fats. Bile is made in the liver and stored in the gallbladder. When you eat a meal, bile passes from the gallbladder through the cystic duct and the common bile duct into the small intestine. There, it mixes with partially digested food. If a gallstone blocks either of these ducts, the normal flow of bile is blocked. The muscle cells in the bile duct contract forcefully to try to move the stone. This causes the pain of biliary colic.  SYMPTOMS   A person with biliary colic usually complains of pain in the upper abdomen. This pain can be:  In the center of the upper abdomen just below the breastbone.  In the upper-right part of the abdomen, near the gallbladder and liver.  Spread back toward the right shoulder blade.  Nausea and vomiting.  The pain usually occurs after eating.  Biliary colic is usually triggered by the digestive system's demand for bile. The demand for bile is high after fatty meals. Symptoms can also occur when a person who has been fasting suddenly eats a very large meal. Most episodes of biliary colic pass after 1 to 5 hours. After the most intense pain passes, your abdomen may continue to ache mildly for about 24 hours. DIAGNOSIS  After you describe your symptoms, your caregiver will perform a physical exam. He or she will pay attention to the upper right portion of your belly (abdomen). This is the area of your liver and gallbladder. An ultrasound will help your caregiver look for gallstones. Specialized scans of the gallbladder may also be done. Blood tests may be done, especially if you have fever or if  your pain persists. PREVENTION  Biliary colic can be prevented by controlling the risk factors for gallstones. Some of these risk factors, such as heredity, increasing age, and pregnancy are a normal part of life. Obesity and a high-fat diet are risk factors you can change through a healthy lifestyle. Women going through menopause who take hormone replacement therapy (estrogen) are also more likely to develop biliary colic. TREATMENT   Pain medication may be prescribed.  You may be encouraged to eat a fat-free diet.  If the first episode of biliary colic is severe, or episodes of colic keep retuning, surgery to remove the gallbladder (cholecystectomy) is usually recommended. This procedure can be done through small incisions using an instrument called a laparoscope. The procedure often requires a brief stay in the hospital. Some people can leave the hospital the same day. It is the most widely used treatment in people troubled by painful gallstones. It is effective and safe, with no complications in more than 90% of cases.  If surgery cannot be done, medication that dissolves gallstones may be used. This medication is expensive and can take months or years to work. Only small stones will dissolve.  Rarely, medication to dissolve gallstones is combined with a procedure called shock-wave lithotripsy. This procedure uses carefully aimed shock waves to break up gallstones. In many people treated with this procedure, gallstones form again within a few years. PROGNOSIS  If gallstones block your cystic duct or common bile duct,  you are at risk for repeated episodes of biliary colic. There is also a 25% chance that you will develop a gallbladder infection(acute cholecystitis), or some other complication of gallstones within 10 to 20 years. If you have surgery, schedule it at a time that is convenient for you and at a time when you are not sick. HOME CARE INSTRUCTIONS   Drink plenty of clear  fluids.  Avoid fatty, greasy or fried foods, or any foods that make your pain worse.  Take medications as directed. SEEK MEDICAL CARE IF:   You develop a fever over 100.5 F (38.1 C).  Your pain gets worse over time.  You develop nausea that prevents you from eating and drinking.  You develop vomiting. SEEK IMMEDIATE MEDICAL CARE IF:   You have continuous or severe belly (abdominal) pain which is not relieved with medications.  You develop nausea and vomiting which is not relieved with medications.  You have symptoms of biliary colic and you suddenly develop a fever and shaking chills. This may signal cholecystitis. Call your caregiver immediately.  You develop a yellow color to your skin or the white part of your eyes (jaundice). Document Released: 09/04/2005 Document Revised: 06/26/2011 Document Reviewed: 11/14/2007 [facility] Patient Information 2015 [place], [place]. This information is not intended to replace advice given to you by your health care provider. Make sure you discuss any questions you have with your health care provider.

## 2014-01-05 NOTE — ED Notes (Signed)
Pt c/o n/v/d and ruq abd pain radiating into back since Friday. Pt states he has gallstones.

## 2014-01-26 ENCOUNTER — Emergency Department (HOSPITAL_COMMUNITY): Payer: BC Managed Care – PPO

## 2014-01-26 ENCOUNTER — Encounter (HOSPITAL_COMMUNITY): Payer: Self-pay | Admitting: Emergency Medicine

## 2014-01-26 ENCOUNTER — Emergency Department (HOSPITAL_COMMUNITY)
Admission: EM | Admit: 2014-01-26 | Discharge: 2014-01-26 | Disposition: A | Discharge status: Home / Self Care | Payer: BC Managed Care – PPO | Attending: Emergency Medicine | Admitting: Emergency Medicine

## 2014-01-26 DIAGNOSIS — K8050 Calculus of bile duct without cholangitis or cholecystitis without obstruction: Secondary | ICD-10-CM | POA: Insufficient documentation

## 2014-01-26 DIAGNOSIS — Z8719 Personal history of other diseases of the digestive system: Secondary | ICD-10-CM | POA: Diagnosis not present

## 2014-01-26 DIAGNOSIS — Z79899 Other long term (current) drug therapy: Secondary | ICD-10-CM | POA: Diagnosis not present

## 2014-01-26 DIAGNOSIS — R1011 Right upper quadrant pain: Secondary | ICD-10-CM | POA: Diagnosis present

## 2014-01-26 DIAGNOSIS — R109 Unspecified abdominal pain: Secondary | ICD-10-CM

## 2014-01-26 DIAGNOSIS — Z720 Tobacco use: Secondary | ICD-10-CM | POA: Diagnosis not present

## 2014-01-26 LAB — CBC WITH DIFFERENTIAL/PLATELET
BASOS ABS: 0 10*3/uL (ref 0.0–0.1)
BASOS PCT: 0 % (ref 0–1)
EOS ABS: 0.1 10*3/uL (ref 0.0–0.7)
EOS PCT: 1 % (ref 0–5)
HEMATOCRIT: 41.1 % (ref 39.0–52.0)
HEMOGLOBIN: 14.3 g/dL (ref 13.0–17.0)
Lymphocytes Relative: 24 % (ref 12–46)
Lymphs Abs: 2.7 10*3/uL (ref 0.7–4.0)
MCH: 31.4 pg (ref 26.0–34.0)
MCHC: 34.8 g/dL (ref 30.0–36.0)
MCV: 90.1 fL (ref 78.0–100.0)
MONOS PCT: 6 % (ref 3–12)
Monocytes Absolute: 0.7 10*3/uL (ref 0.1–1.0)
Neutro Abs: 7.9 10*3/uL — ABNORMAL HIGH (ref 1.7–7.7)
Neutrophils Relative %: 69 % (ref 43–77)
Platelets: 277 10*3/uL (ref 150–400)
RBC: 4.56 MIL/uL (ref 4.22–5.81)
RDW: 12.5 % (ref 11.5–15.5)
WBC: 11.4 10*3/uL — ABNORMAL HIGH (ref 4.0–10.5)

## 2014-01-26 LAB — URINALYSIS, ROUTINE W REFLEX MICROSCOPIC
Bilirubin Urine: NEGATIVE
Glucose, UA: NEGATIVE mg/dL
Hgb urine dipstick: NEGATIVE
KETONES UR: NEGATIVE mg/dL
LEUKOCYTES UA: NEGATIVE
NITRITE: NEGATIVE
PH: 5.5 (ref 5.0–8.0)
Protein, ur: NEGATIVE mg/dL
Urobilinogen, UA: 0.2 mg/dL (ref 0.0–1.0)

## 2014-01-26 LAB — LIPASE, BLOOD: LIPASE: 36 U/L (ref 11–59)

## 2014-01-26 LAB — COMPREHENSIVE METABOLIC PANEL
ALBUMIN: 3.8 g/dL (ref 3.5–5.2)
ALT: 12 U/L (ref 0–53)
ANION GAP: 11 (ref 5–15)
AST: 11 U/L (ref 0–37)
Alkaline Phosphatase: 110 U/L (ref 39–117)
BUN: 10 mg/dL (ref 6–23)
CALCIUM: 9.3 mg/dL (ref 8.4–10.5)
CHLORIDE: 102 meq/L (ref 96–112)
CO2: 26 mEq/L (ref 19–32)
CREATININE: 0.77 mg/dL (ref 0.50–1.35)
GFR calc Af Amer: >90 mL/min (ref >90)
GFR calc non Af Amer: >90 mL/min (ref >90)
Glucose, Bld: 101 mg/dL — ABNORMAL HIGH (ref 70–99)
Potassium: 3.9 mEq/L (ref 3.7–5.3)
Sodium: 139 mEq/L (ref 137–147)
Total Bilirubin: 0.3 mg/dL (ref 0.3–1.2)
Total Protein: 7.4 g/dL (ref 6.0–8.3)

## 2014-01-26 MED ORDER — ONDANSETRON HCL 4 MG PO TABS: 4.0000 mg | ORAL_TABLET | Freq: Four times a day (QID) | ORAL | Status: AC

## 2014-01-26 MED ORDER — SODIUM CHLORIDE 0.9 % IV BOLUS (SEPSIS)
1000.0000 mL | Freq: Once | INTRAVENOUS | Status: AC
  Administered 2014-01-26: 1000 mL via INTRAVENOUS

## 2014-01-26 MED ORDER — ONDANSETRON HCL 4 MG/2ML IJ SOLN
4.0000 mg | Freq: Once | INTRAMUSCULAR | Status: AC
  Administered 2014-01-26: 4 mg via INTRAVENOUS
  Filled 2014-01-26: qty 2

## 2014-01-26 MED ORDER — HYDROMORPHONE HCL 1 MG/ML IJ SOLN
1.0000 mg | Freq: Once | INTRAMUSCULAR | Status: AC
  Administered 2014-01-26: 1 mg via INTRAVENOUS
  Filled 2014-01-26: qty 1

## 2014-01-26 MED ORDER — HYDROCODONE-ACETAMINOPHEN 5-325 MG PO TABS: 2.0000 | ORAL_TABLET | ORAL | Status: DC | PRN

## 2014-01-26 NOTE — ED Notes (Signed)
PT has dx of gallstones and has an appointment with a surgeon tomorrow in the am.

## 2014-01-26 NOTE — ED Notes (Addendum)
Patient complaining of right upper quadrant abdominal pain since yesterday. States he has been diagnosed with gall stones and has an appointment tomorrow with Dr. Lovell SheehanJenkins. Also complaining of "a little vomiting, and diarrhea."

## 2014-01-26 NOTE — Discharge Instructions (Signed)
Biliary Colic  Follow up with Dr. Lovell SheehanJenkins tomorrow as scheduled. Return to the ED if you develop new or worsening symptoms. Biliary colic is a steady or irregular pain in the upper abdomen. It is usually under the right side of the rib cage. It happens when gallstones interfere with the normal flow of bile from the gallbladder. Bile is a liquid that helps to digest fats. Bile is made in the liver and stored in the gallbladder. When you eat a meal, bile passes from the gallbladder through the cystic duct and the common bile duct into the small intestine. There, it mixes with partially digested food. If a gallstone blocks either of these ducts, the normal flow of bile is blocked. The muscle cells in the bile duct contract forcefully to try to move the stone. This causes the pain of biliary colic.  SYMPTOMS   A person with biliary colic usually complains of pain in the upper abdomen. This pain can be:  In the center of the upper abdomen just below the breastbone.  In the upper-right part of the abdomen, near the gallbladder and liver.  Spread back toward the right shoulder blade.  Nausea and vomiting.  The pain usually occurs after eating.  Biliary colic is usually triggered by the digestive system's demand for bile. The demand for bile is high after fatty meals. Symptoms can also occur when a person who has been fasting suddenly eats a very large meal. Most episodes of biliary colic pass after 1 to 5 hours. After the most intense pain passes, your abdomen may continue to ache mildly for about 24 hours. DIAGNOSIS  After you describe your symptoms, your caregiver will perform a physical exam. He or she will pay attention to the upper right portion of your belly (abdomen). This is the area of your liver and gallbladder. An ultrasound will help your caregiver look for gallstones. Specialized scans of the gallbladder may also be done. Blood tests may be done, especially if you have fever or if your pain  persists. PREVENTION  Biliary colic can be prevented by controlling the risk factors for gallstones. Some of these risk factors, such as heredity, increasing age, and pregnancy are a normal part of life. Obesity and a high-fat diet are risk factors you can change through a healthy lifestyle. Women going through menopause who take hormone replacement therapy (estrogen) are also more likely to develop biliary colic. TREATMENT   Pain medication may be prescribed.  You may be encouraged to eat a fat-free diet.  If the first episode of biliary colic is severe, or episodes of colic keep retuning, surgery to remove the gallbladder (cholecystectomy) is usually recommended. This procedure can be done through small incisions using an instrument called a laparoscope. The procedure often requires a brief stay in the hospital. Some people can leave the hospital the same day. It is the most widely used treatment in people troubled by painful gallstones. It is effective and safe, with no complications in more than 90% of cases.  If surgery cannot be done, medication that dissolves gallstones may be used. This medication is expensive and can take months or years to work. Only small stones will dissolve.  Rarely, medication to dissolve gallstones is combined with a procedure called shock-wave lithotripsy. This procedure uses carefully aimed shock waves to break up gallstones. In many people treated with this procedure, gallstones form again within a few years. PROGNOSIS  If gallstones block your cystic duct or common bile duct, you  are at risk for repeated episodes of biliary colic. There is also a 25% chance that you will develop a gallbladder infection(acute cholecystitis), or some other complication of gallstones within 10 to 20 years. If you have surgery, schedule it at a time that is convenient for you and at a time when you are not sick. HOME CARE INSTRUCTIONS   Drink plenty of clear fluids.  Avoid fatty,  greasy or fried foods, or any foods that make your pain worse.  Take medications as directed. SEEK MEDICAL CARE IF:   You develop a fever over 100.5 F (38.1 C).  Your pain gets worse over time.  You develop nausea that prevents you from eating and drinking.  You develop vomiting. SEEK IMMEDIATE MEDICAL CARE IF:   You have continuous or severe belly (abdominal) pain which is not relieved with medications.  You develop nausea and vomiting which is not relieved with medications.  You have symptoms of biliary colic and you suddenly develop a fever and shaking chills. This may signal cholecystitis. Call your caregiver immediately.  You develop a yellow color to your skin or the white part of your eyes (jaundice). Document Released: 09/04/2005 Document Revised: 06/26/2011 Document Reviewed: 11/14/2007 [facility] Patient Information 2015 [place]. This information is not intended to replace advice given to you by your health care provider. Make sure you discuss any questions you have with your health care provider.

## 2014-01-26 NOTE — ED Notes (Signed)
Sprite given to patient for PO Challenge.

## 2014-01-26 NOTE — ED Provider Notes (Signed)
CSN: [id number]     Arrival date & time 01/26/14  [id number] History  This chart was scribe for Eric OctaveStephen Laquiesha Piacente, MD by Eric Flores, ED Scribe. The patient was seen in room APA10/APA10 and the patient's care was started at 8:47 AM.   Chief Complaint  Patient presents with  . Abdominal Pain    The history is provided by the patient and a friend. No language interpreter was used.   HPI Comments: Eric Flores is a 43 y.o. male who presents to the Emergency Department complaining of constant RUQ abdominal pain beginning about 8 hours ago. He reports associated vomiting and diarrhea; states he has vomited twice since onset of pain and has had 2-3 daily episodes of diarrhea for the past 3 days. He reports a similar episode for which he was seen on 01/05/2014 and was diagnosed with gall stones. Patient states his symptoms resolved after that visit. He is not sure if he ate anything to set off to cause this current episode. Patient states he has an appointment with Dr. Lovell SheehanJenkins tomorrow. He denies fever, testicular pain, dysuria. He has no previous abdominal surgeries.   Past Medical History  Diagnosis Date  . Gastritis   . Gallstones    History reviewed. No pertinent past surgical history. History reviewed. No pertinent family history. History  Substance Use Topics  . Smoking status: Current Every Day Smoker -- 1.00 packs/day  . Smokeless tobacco: Never Used  . Alcohol Use: No    Review of Systems  A complete 10 system review of systems was obtained and all systems are negative except as noted in the HPI and PMH.    Allergies  Review of patient's allergies indicates no known allergies.  Home Medications   Prior to Admission medications   Medication Sig Start Date End Date Taking? Authorizing Provider  omeprazole (PRILOSEC) 20 MG capsule Take 20 mg by mouth daily.   Yes Historical Provider, MD  ondansetron (ZOFRAN) 4 MG tablet Take 1 tablet (4 mg total) by mouth every 6 (six) hours.  01/05/14  Yes Eric OctaveStephen Eric Gilkey, MD  HYDROcodone-acetaminophen (NORCO/VICODIN) 5-325 MG per tablet Take 2 tablets by mouth every 4 (four) hours as needed. 01/26/14   Eric OctaveStephen Cadey Bazile, MD  ondansetron (ZOFRAN) 4 MG tablet Take 1 tablet (4 mg total) by mouth every 6 (six) hours. 01/26/14   Eric OctaveStephen Eric Tuccillo, MD   BP 121/78  Pulse 86  Temp(Src) 97.4 F (36.3 C) (Oral)  Resp 16  SpO2 100% Physical Exam  Nursing note and vitals reviewed. Constitutional: He is oriented to person, place, and time. He appears well-developed and well-nourished. No distress.  Uncomfortable appearing   HENT:  Head: Normocephalic and atraumatic.  Mouth/Throat: Oropharynx is clear and moist. No oropharyngeal exudate.  Eyes: Conjunctivae and EOM are normal. Pupils are equal, round, and reactive to light.  Neck: Normal range of motion. Neck supple.  No meningismus.  Cardiovascular: Normal rate, regular rhythm, normal heart sounds and intact distal pulses.   No murmur heard. Pulmonary/Chest: Effort normal and breath sounds normal. No respiratory distress.  Abdominal: Soft. There is tenderness. There is guarding. There is no rebound.  Tender in RUQ with guarding.  Genitourinary:  No CVA tenderness.  Musculoskeletal: Normal range of motion. He exhibits no edema and no tenderness.  Neurological: He is alert and oriented to person, place, and time. No cranial nerve deficit. He exhibits normal muscle tone. Coordination normal.  No ataxia on finger to nose bilaterally. No pronator drift. 5/5 strength throughout.  CN 2-12 intact. Negative Romberg. Equal grip strength. Sensation intact. Gait is normal.   Skin: Skin is warm.  Psychiatric: He has a normal mood and affect. His behavior is normal.    ED Course  Procedures (including critical care time) DIAGNOSTIC STUDIES: Oxygen Saturation is 96% on RA, normal by my interpretation.    COORDINATION OF CARE: 8:52 AM-Will give IV fluids, Zofran, and Dilaudid. Discussed treatment  plan which includes lab work with pt at bedside and pt agreed to plan.  6:06 PM will see specialist tomorrow   Labs Review Labs Reviewed  CBC WITH DIFFERENTIAL - Abnormal; Notable for the following:    WBC 11.4 (*)    Neutro Abs 7.9 (*)    All other components within normal limits  COMPREHENSIVE METABOLIC PANEL - Abnormal; Notable for the following:    Glucose, Bld 101 (*)    All other components within normal limits  URINALYSIS, ROUTINE W REFLEX MICROSCOPIC - Abnormal; Notable for the following:    Specific Gravity, Urine >1.030 (*)    All other components within normal limits  LIPASE, BLOOD    Imaging Review [place] Abdomen Limited Ruq  01/26/2014   CLINICAL DATA:  Right upper quadrant abdominal pain.  EXAM: US ABDOMEN LIMITED - RIGHT UPPER QUADRANT  COMPARISON:  01/05/2014  FINDINGS: Gallbladder:  Echogenic material fills the gallbladder, some of which appears to be shadowing. Appearance compatible with sludge with internal gallstones. Gallbladder wall thickening is present at 4 mm.  Common bile duct:  Diameter: 7 mm, abnormally dilated.  Liver:  No focal lesion identified. Within normal limits in parenchymal echogenicity.  IMPRESSION: 1. Gallstones and echogenic material in the gallbladder favoring some overlying sludge. Gallbladder wall thickening is present, as before. Correlate clinically in assessing for acute cholecystitis. 2. Mildly dilated common bile duct at 7 mm. We did not directly visualized choledocholithiasis.   Electronically Signed   By: Herbie BaltimoreWalt  Liebkemann M.D.   On: 01/26/2014 11:49     EKG Interpretation None      MDM   Final diagnoses:  Abdominal pain  Biliary colic   Recurrent right upper quadrant pain with nausea and vomiting. known gallstones. Has appointment with Dr. Lovell SheehanJenkins tomorrow.  Abdomen soft but tender in the right upper quadrant. White blood cell count 11. LFTs normal. Lipase normal  Ultrasound results reviewed with patient. Discussed with Dr.  Lovell SheehanJenkins. He will see patient in the office tomorrow as scheduled. Labs appear to be at baseline and no evidence of acute cholecystitis. Will prescribe pain and nausea medications and patient to see Dr. Lovell SheehanJenkins tomorrow. Return precautions discussed. He is tolerating PO.   I personally performed the services described in this documentation, which was scribed in my presence. The recorded information has been reviewed and is accurate.   Eric OctaveStephen Elene Downum, MD 01/26/14 1806

## 2014-01-28 NOTE — H&P (Signed)
  NTS SOAP Note  Vital Signs:  Vitals as of: 01/27/2014: Systolic 132: Diastolic 82: Heart Rate 77: Temp 97.82F: Height 315ft 8in: Weight 170Lbs 0 Ounces: Pain Level 7: BMI 25.85  BMI : 25.85 kg/m2  Subjective: This 43 year old male presents for of abdominal pain and nausea.  Has been present for some time.  Has had multiple ER visits.  No fever,  chills,  jaundice.  Review of Symptoms:  Constitutional:fatigue Head:unremarkable Eyes:unremarkable   Nose/Mouth/Throat:unremarkable Cardiovascular:  unremarkable Respiratory:unremarkable Gastrointestinabdominal pain, nausea, heartburn, dyspepsia Genitourinary:unremarkable   joint,  neck,  and back pain Skin:unremarkable Hematolgic/Lymphatic:unremarkable   Allergic/Immunologic:unremarkable   Past Medical History:  Reviewed  Past Medical History  Surgical History: none Medical Problems: none Allergies: nkda Medications: norco,  zofran   Social History:Reviewed  Social History  Preferred Language: English Race:  White Ethnicity: Not Hispanic / Latino Age: [age] year Marital Status:  S Alcohol: socially   Smoking Status: Current every day smoker reviewed on 01/27/2014 Started Date:  Packs per week:  Functional Status reviewed on 01/27/2014 ------------------------------------------------ Bathing: Normal Cooking: Normal Dressing: Normal Driving: Normal Eating: Normal Managing Meds: Normal Oral Care: Normal Shopping: Normal Toileting: Normal Transferring: Normal Walking: Normal Cognitive Status reviewed on 01/27/2014 ------------------------------------------------ Attention: Normal Decision Making: Normal Language: Normal Memory: Normal Motor: Normal Perception: Normal Problem Solving: Normal Visual and Spatial: Normal   Family History:Reviewed  Family Health History Mother, Living; Hypertension (high blood pressure);  Father, Living; Diabetes mellitus, unspecified type;      Objective Information: General:Well appearing, well nourished in no distress. no scleral icterus Heart:RRR, no murmur or gallop.  Normal S1, S2.  No S3, S4.  Lungs:  CTA bilaterally, no wheezes, rhonchi, rales.  Breathing unlabored. Abdomen:Soft, slightly tender in the right upper quadrant to palpation,  ND, normal bowel sounds, no HSM, no masses.  No peritoneal signs. Lymphatics: U/S of gallbladder:  cholelithiasis,  normal common bile duct   Assessment:Cholecystitis,  cholelithiasis   Diagnoses: 574.00  K80.00 Calculus of gallbladder with acute cholecystitis (Calculus of gallbladder with acute cholecystitis without obstruction)  Procedures: [id number] - OFFICE OUTPATIENT NEW 30 MINUTES    Plan:  Scheduled for laparoscopic cholecystectomy on 02/05/14.   Patient Education:Alternative treatments to surgery were discussed with patient (and family).  Risks and benefits  of procedure including bleeding,  infection,  hepatobiliary injury,  and the possibility of an open procedure were fully explained to the patient (and family) who gave informed consent. Patient/family questions were addressed.  Follow-up:Pending Surgery

## 2014-01-30 ENCOUNTER — Encounter (HOSPITAL_COMMUNITY): Payer: Self-pay | Admitting: Pharmacy Technician

## 2014-02-02 ENCOUNTER — Encounter (HOSPITAL_COMMUNITY)
Admission: RE | Admit: 2014-02-02 | Discharge: 2014-02-02 | Disposition: A | Discharge status: Home / Self Care | Payer: BC Managed Care – PPO | Source: Ambulatory Visit | Attending: General Surgery | Admitting: General Surgery

## 2014-02-02 ENCOUNTER — Encounter (HOSPITAL_COMMUNITY): Payer: Self-pay

## 2014-02-02 DIAGNOSIS — Z79899 Other long term (current) drug therapy: Secondary | ICD-10-CM | POA: Diagnosis not present

## 2014-02-02 DIAGNOSIS — K8010 Calculus of gallbladder with chronic cholecystitis without obstruction: Secondary | ICD-10-CM | POA: Diagnosis not present

## 2014-02-02 DIAGNOSIS — F17200 Nicotine dependence, unspecified, uncomplicated: Secondary | ICD-10-CM | POA: Diagnosis not present

## 2014-02-02 DIAGNOSIS — K219 Gastro-esophageal reflux disease without esophagitis: Secondary | ICD-10-CM | POA: Diagnosis not present

## 2014-02-02 HISTORY — DX: Gastro-esophageal reflux disease without esophagitis: K21.9

## 2014-02-02 NOTE — Patient Instructions (Signed)
Eric RidgeDavid M Flores  02/02/2014   Your procedure is scheduled on:  02/05/2014  [facility]  [facility].  Call this number if you have problems the morning of surgery: [phone]   Remember:   Do not eat food or drink liquids after midnight.   Take these medicines the morning of surgery with A SIP OF WATER:  Prilosec, zofran, oxycodone   Do not wear jewelry, make-up or nail polish.  Do not wear lotions, powders, or perfumes.   Do not shave 48 hours prior to surgery. Men may shave face and neck.  Do not bring valuables to the hospital.  [facility] not responsible for any belongings or valuables.               Contacts, dentures or bridgework may not be worn into surgery.  Leave suitcase in the car. After surgery it may be brought to your room.  For patients admitted to the hospital, discharge time is determined by your treatment team.               Patients discharged the day of surgery will not be allowed to drive home.  Name and phone number of your driver: family  Special Instructions: Shower using CHG 2 nights before surgery and the night before surgery.  If you shower the day of surgery use CHG.  Use special wash - you have one bottle of CHG for all showers.  You should use approximately 1/3 of the bottle for each shower.   Please read over the following fact sheets that you were given: Pain Booklet, Coughing and Deep Breathing, Surgical Site Infection Prevention, Anesthesia Post-op Instructions and Care and Recovery After Surgery Laparoscopic Cholecystectomy Laparoscopic cholecystectomy is surgery to remove the gallbladder. The gallbladder is located in the upper right part of the abdomen, behind the liver. It is a storage sac for bile produced in the liver. Bile aids in the digestion and absorption of fats. Cholecystectomy is often done for inflammation of the gallbladder (cholecystitis). This condition is usually caused by a buildup of gallstones (cholelithiasis) in  your gallbladder. Gallstones can block the flow of bile, resulting in inflammation and pain. In severe cases, emergency surgery may be required. When emergency surgery is not required, you will have time to prepare for the procedure. Laparoscopic surgery is an alternative to open surgery. Laparoscopic surgery has a shorter recovery time. Your common bile duct may also need to be examined during the procedure. If stones are found in the common bile duct, they may be removed. LET [facility] KNOW ABOUT:  Any allergies you have.  All medicines you are taking, including vitamins, herbs, eye drops, creams, and over-the-counter medicines.  Previous problems you or members of your family have had with the use of anesthetics.  Any blood disorders you have.  Previous surgeries you have had.  Medical conditions you have. RISKS AND COMPLICATIONS Generally, this is a safe procedure. However, as with any procedure, complications can occur. Possible complications include:  Infection.  Damage to the common bile duct, nerves, arteries, veins, or other internal organs such as the stomach, liver, or intestines.  Bleeding.  A stone may remain in the common bile duct.  A bile leak from the cyst duct that is clipped when your gallbladder is removed.  The need to convert to open surgery, which requires a larger incision in the abdomen. This may be necessary if your surgeon thinks  it is not safe to continue with a laparoscopic procedure. BEFORE THE PROCEDURE  Ask your health care provider about changing or stopping any regular medicines. You will need to stop taking aspirin or blood thinners at least 5 days prior to surgery.  Do not eat or drink anything after midnight the night before surgery.  Let your health care provider know if you develop a cold or other infectious problem before surgery. PROCEDURE   You will be given medicine to make you sleep through the procedure (general  anesthetic). A breathing tube will be placed in your mouth.  When you are asleep, your surgeon will make several small cuts (incisions) in your abdomen.  A thin, lighted tube with a tiny camera on the end (laparoscope) is inserted through one of the small incisions. The camera on the laparoscope sends a picture to a TV screen in the operating room. This gives the surgeon a good view inside your abdomen.  A gas will be pumped into your abdomen. This expands your abdomen so that the surgeon has more room to perform the surgery.  Other tools needed for the procedure are inserted through the other incisions. The gallbladder is removed through one of the incisions.  After the removal of your gallbladder, the incisions will be closed with stitches, staples, or skin glue. AFTER THE PROCEDURE  You will be taken to a recovery area where your progress will be checked often.  You may be allowed to go home the same day if your pain is controlled and you can tolerate liquids. Document Released: 04/03/2005 Document Revised: 01/22/2013 Document Reviewed: 11/13/2012 [facility] Patient Information 2015 Eric, Flores. This information is not intended to replace advice given to you by your health care provider. Make sure you discuss any questions you have with your health care provider. PATIENT INSTRUCTIONS POST-ANESTHESIA  IMMEDIATELY FOLLOWING SURGERY:  Do not drive or operate machinery for the first twenty four hours after surgery.  Do not make any important decisions for twenty four hours after surgery or while taking narcotic pain medications or sedatives.  If you develop intractable nausea and vomiting or a severe headache please notify your doctor immediately.  FOLLOW-UP:  Please make an appointment with your surgeon as instructed. You do not need to follow up with anesthesia unless specifically instructed to do so.  WOUND CARE INSTRUCTIONS (if applicable):  Keep a dry clean dressing on the  anesthesia/puncture wound site if there is drainage.  Once the wound has quit draining you may leave it open to air.  Generally you should leave the bandage intact for twenty four hours unless there is drainage.  If the epidural site drains for more than 36-48 hours please call the anesthesia department.  QUESTIONS?:  Please feel free to call your physician or the hospital operator if you have any questions, and they will be happy to assist you.

## 2014-02-02 NOTE — Pre-Procedure Instructions (Signed)
Patient given information to sign up for my chart at home. 

## 2014-02-05 ENCOUNTER — Encounter (HOSPITAL_COMMUNITY): Payer: BC Managed Care – PPO | Admitting: Anesthesiology

## 2014-02-05 ENCOUNTER — Encounter (HOSPITAL_COMMUNITY): Payer: Self-pay | Admitting: *Deleted

## 2014-02-05 ENCOUNTER — Encounter (HOSPITAL_COMMUNITY)
Admission: RE | Disposition: A | Discharge status: Home / Self Care | Payer: Self-pay | Source: Ambulatory Visit | Attending: General Surgery

## 2014-02-05 ENCOUNTER — Ambulatory Visit (HOSPITAL_COMMUNITY)
Admission: RE | Admit: 2014-02-05 | Discharge: 2014-02-05 | Disposition: A | Discharge status: Home / Self Care | Payer: BC Managed Care – PPO | Source: Ambulatory Visit | Attending: General Surgery | Admitting: General Surgery

## 2014-02-05 ENCOUNTER — Ambulatory Visit (HOSPITAL_COMMUNITY): Payer: BC Managed Care – PPO | Admitting: Anesthesiology

## 2014-02-05 DIAGNOSIS — K8010 Calculus of gallbladder with chronic cholecystitis without obstruction: Secondary | ICD-10-CM | POA: Insufficient documentation

## 2014-02-05 DIAGNOSIS — K219 Gastro-esophageal reflux disease without esophagitis: Secondary | ICD-10-CM | POA: Insufficient documentation

## 2014-02-05 DIAGNOSIS — Z79899 Other long term (current) drug therapy: Secondary | ICD-10-CM | POA: Insufficient documentation

## 2014-02-05 DIAGNOSIS — F17200 Nicotine dependence, unspecified, uncomplicated: Secondary | ICD-10-CM | POA: Insufficient documentation

## 2014-02-05 HISTORY — PX: CHOLECYSTECTOMY: SHX55

## 2014-02-05 SURGERY — LAPAROSCOPIC CHOLECYSTECTOMY
Anesthesia: General | Site: Abdomen

## 2014-02-05 MED ORDER — DEXAMETHASONE SODIUM PHOSPHATE 4 MG/ML IJ SOLN
INTRAMUSCULAR | Status: AC
  Filled 2014-02-05: qty 1

## 2014-02-05 MED ORDER — DEXAMETHASONE SODIUM PHOSPHATE 4 MG/ML IJ SOLN
4.0000 mg | Freq: Once | INTRAMUSCULAR | Status: AC
  Administered 2014-02-05: 4 mg via INTRAVENOUS

## 2014-02-05 MED ORDER — NEOSTIGMINE METHYLSULFATE 10 MG/10ML IV SOLN
INTRAVENOUS | Status: DC | PRN
  Administered 2014-02-05: 5 mg via INTRAVENOUS

## 2014-02-05 MED ORDER — POVIDONE-IODINE 10 % OINT PACKET
TOPICAL_OINTMENT | CUTANEOUS | Status: DC | PRN
  Administered 2014-02-05: 1 via TOPICAL

## 2014-02-05 MED ORDER — BUPIVACAINE HCL (PF) 0.5 % IJ SOLN
INTRAMUSCULAR | Status: DC | PRN
  Administered 2014-02-05: 10 mL

## 2014-02-05 MED ORDER — PROPOFOL 10 MG/ML IV EMUL
INTRAVENOUS | Status: AC
  Filled 2014-02-05: qty 20

## 2014-02-05 MED ORDER — ONDANSETRON HCL 4 MG/2ML IJ SOLN
4.0000 mg | Freq: Once | INTRAMUSCULAR | Status: AC | PRN
  Administered 2014-02-05: 4 mg via INTRAVENOUS
  Filled 2014-02-05: qty 2

## 2014-02-05 MED ORDER — NEOSTIGMINE METHYLSULFATE 10 MG/10ML IV SOLN
INTRAVENOUS | Status: AC
  Filled 2014-02-05: qty 1

## 2014-02-05 MED ORDER — FENTANYL CITRATE 0.05 MG/ML IJ SOLN
INTRAMUSCULAR | Status: AC
  Filled 2014-02-05: qty 5

## 2014-02-05 MED ORDER — CIPROFLOXACIN IN D5W 400 MG/200ML IV SOLN
INTRAVENOUS | Status: DC | PRN
  Administered 2014-02-05: 400 mg via INTRAVENOUS

## 2014-02-05 MED ORDER — GLYCOPYRROLATE 0.2 MG/ML IJ SOLN
INTRAMUSCULAR | Status: AC
  Filled 2014-02-05: qty 3

## 2014-02-05 MED ORDER — PROPOFOL 10 MG/ML IV BOLUS
INTRAVENOUS | Status: DC | PRN
  Administered 2014-02-05: 160 mg via INTRAVENOUS

## 2014-02-05 MED ORDER — MIDAZOLAM HCL 2 MG/2ML IJ SOLN
INTRAMUSCULAR | Status: AC
  Filled 2014-02-05: qty 2

## 2014-02-05 MED ORDER — POVIDONE-IODINE 10 % EX OINT
TOPICAL_OINTMENT | CUTANEOUS | Status: AC
  Filled 2014-02-05: qty 2

## 2014-02-05 MED ORDER — ROCURONIUM BROMIDE 100 MG/10ML IV SOLN
INTRAVENOUS | Status: DC | PRN
  Administered 2014-02-05: 10 mg via INTRAVENOUS
  Administered 2014-02-05: 20 mg via INTRAVENOUS

## 2014-02-05 MED ORDER — KETOROLAC TROMETHAMINE 30 MG/ML IJ SOLN
30.0000 mg | Freq: Once | INTRAMUSCULAR | Status: AC
  Administered 2014-02-05: 30 mg via INTRAVENOUS
  Filled 2014-02-05: qty 1

## 2014-02-05 MED ORDER — SUCCINYLCHOLINE CHLORIDE 20 MG/ML IJ SOLN
INTRAMUSCULAR | Status: AC
  Filled 2014-02-05: qty 1

## 2014-02-05 MED ORDER — FENTANYL CITRATE 0.05 MG/ML IJ SOLN
INTRAMUSCULAR | Status: DC | PRN
  Administered 2014-02-05 (×2): 100 ug via INTRAVENOUS
  Administered 2014-02-05: 50 ug via INTRAVENOUS

## 2014-02-05 MED ORDER — MIDAZOLAM HCL 2 MG/2ML IJ SOLN
1.0000 mg | INTRAMUSCULAR | Status: DC | PRN
  Administered 2014-02-05: 2 mg via INTRAVENOUS

## 2014-02-05 MED ORDER — SODIUM CHLORIDE 0.9 % IR SOLN
Status: DC | PRN
  Administered 2014-02-05: 1000 mL

## 2014-02-05 MED ORDER — CHLORHEXIDINE GLUCONATE 4 % EX LIQD
1.0000 | Freq: Once | CUTANEOUS | Status: DC
Start: 2014-02-05 — End: 2014-02-05

## 2014-02-05 MED ORDER — OXYCODONE-ACETAMINOPHEN 7.5-325 MG PO TABS: 1.0000 | ORAL_TABLET | ORAL | Status: AC | PRN

## 2014-02-05 MED ORDER — SUCCINYLCHOLINE CHLORIDE 20 MG/ML IJ SOLN
INTRAMUSCULAR | Status: DC | PRN
  Administered 2014-02-05: 100 mg via INTRAVENOUS

## 2014-02-05 MED ORDER — ONDANSETRON HCL 4 MG/2ML IJ SOLN
INTRAMUSCULAR | Status: AC
  Filled 2014-02-05: qty 2

## 2014-02-05 MED ORDER — FENTANYL CITRATE 0.05 MG/ML IJ SOLN
25.0000 ug | INTRAMUSCULAR | Status: DC | PRN
  Administered 2014-02-05 (×2): 50 ug via INTRAVENOUS
  Filled 2014-02-05: qty 2

## 2014-02-05 MED ORDER — HEMOSTATIC AGENTS (NO CHARGE) OPTIME
TOPICAL | Status: DC | PRN
  Administered 2014-02-05: 1 via TOPICAL

## 2014-02-05 MED ORDER — ONDANSETRON HCL 4 MG/2ML IJ SOLN
4.0000 mg | Freq: Once | INTRAMUSCULAR | Status: AC
  Administered 2014-02-05: 4 mg via INTRAVENOUS

## 2014-02-05 MED ORDER — CIPROFLOXACIN IN D5W 400 MG/200ML IV SOLN
400.0000 mg | INTRAVENOUS | Status: DC
  Filled 2014-02-05: qty 200

## 2014-02-05 MED ORDER — GLYCOPYRROLATE 0.2 MG/ML IJ SOLN
INTRAMUSCULAR | Status: DC | PRN
  Administered 2014-02-05: 0.6 mg via INTRAVENOUS

## 2014-02-05 MED ORDER — LACTATED RINGERS IV SOLN
INTRAVENOUS | Status: DC
Start: 2014-02-05 — End: 2014-02-05
  Administered 2014-02-05 (×2): via INTRAVENOUS

## 2014-02-05 MED ORDER — BUPIVACAINE HCL (PF) 0.5 % IJ SOLN
INTRAMUSCULAR | Status: AC
  Filled 2014-02-05: qty 30

## 2014-02-05 SURGICAL SUPPLY — 45 items
APPLIER CLIP LAPSCP 10X32 DD (CLIP) ×3 IMPLANT
APPLIER CLIP ROT 10 11.4 M/L (STAPLE) ×3
BAG HAMPER (MISCELLANEOUS) ×3 IMPLANT
BLADE 11 SAFETY STRL DISP (BLADE) IMPLANT
CHLORAPREP W/TINT 26ML (MISCELLANEOUS) ×3 IMPLANT
CLIP APPLIE ROT 10 11.4 M/L (STAPLE) ×1 IMPLANT
CLOTH BEACON ORANGE TIMEOUT ST (SAFETY) ×3 IMPLANT
COVER LIGHT HANDLE STERIS (MISCELLANEOUS) ×6 IMPLANT
DECANTER SPIKE VIAL GLASS SM (MISCELLANEOUS) ×3 IMPLANT
DURAPREP 26ML APPLICATOR (WOUND CARE) IMPLANT
ELECT REM PT RETURN 9FT ADLT (ELECTROSURGICAL) ×3
ELECTRODE REM PT RTRN 9FT ADLT (ELECTROSURGICAL) ×1 IMPLANT
FILTER SMOKE EVAC LAPAROSHD (FILTER) ×3 IMPLANT
FORMALIN 10 PREFIL 120ML (MISCELLANEOUS) ×3 IMPLANT
GLOVE BIOGEL PI IND STRL 7.0 (GLOVE) ×2 IMPLANT
GLOVE BIOGEL PI INDICATOR 7.0 (GLOVE) ×4
GLOVE ECLIPSE 6.5 STRL STRAW (GLOVE) ×3 IMPLANT
GLOVE SURG SS PI 7.5 STRL IVOR (GLOVE) ×3 IMPLANT
GOWN STRL REUS W/ TWL XL LVL3 (GOWN DISPOSABLE) ×1 IMPLANT
GOWN STRL REUS W/TWL LRG LVL3 (GOWN DISPOSABLE) ×6 IMPLANT
GOWN STRL REUS W/TWL XL LVL3 (GOWN DISPOSABLE) ×2
HEMOSTAT SNOW SURGICEL 2X4 (HEMOSTASIS) ×3 IMPLANT
INST SET LAPROSCOPIC AP (KITS) ×3 IMPLANT
IV NS IRRIG 3000ML ARTHROMATIC (IV SOLUTION) IMPLANT
KIT ROOM TURNOVER APOR (KITS) ×3 IMPLANT
MANIFOLD NEPTUNE II (INSTRUMENTS) ×3 IMPLANT
NEEDLE INSUFFLATION 14GA 120MM (NEEDLE) ×3 IMPLANT
NS IRRIG 1000ML POUR BTL (IV SOLUTION) ×3 IMPLANT
PACK LAP CHOLE LZT030E (CUSTOM PROCEDURE TRAY) ×3 IMPLANT
PAD ARMBOARD 7.5X6 YLW CONV (MISCELLANEOUS) ×3 IMPLANT
POUCH SPECIMEN RETRIEVAL 10MM (ENDOMECHANICALS) ×3 IMPLANT
SET BASIN LINEN APH (SET/KITS/TRAYS/PACK) ×3 IMPLANT
SET TUBE IRRIG SUCTION NO TIP (IRRIGATION / IRRIGATOR) IMPLANT
SLEEVE ENDOPATH XCEL 5M (ENDOMECHANICALS) ×3 IMPLANT
SPONGE GAUZE 2X2 8PLY STER LF (GAUZE/BANDAGES/DRESSINGS) ×4
SPONGE GAUZE 2X2 8PLY STRL LF (GAUZE/BANDAGES/DRESSINGS) ×8 IMPLANT
STAPLER VISISTAT (STAPLE) ×3 IMPLANT
SUT VICRYL 0 UR6 27IN ABS (SUTURE) ×3 IMPLANT
TAPE CLOTH SURG 4X10 WHT LF (GAUZE/BANDAGES/DRESSINGS) ×3 IMPLANT
TROCAR ENDO BLADELESS 11MM (ENDOMECHANICALS) ×3 IMPLANT
TROCAR XCEL NON-BLD 5MMX100MML (ENDOMECHANICALS) ×3 IMPLANT
TROCAR XCEL UNIV SLVE 11M 100M (ENDOMECHANICALS) ×3 IMPLANT
TUBING INSUFFLATION (TUBING) ×3 IMPLANT
WARMER LAPAROSCOPE (MISCELLANEOUS) ×3 IMPLANT
YANKAUER SUCT 12FT TUBE ARGYLE (SUCTIONS) ×3 IMPLANT

## 2014-02-05 NOTE — Op Note (Signed)
Patient:  Eric RidgeDavid M Adkins  DOB:  1970/08/28  MRN:  [id number]   Preop Diagnosis:  Cholecystitis, cholelithiasis  Postop Diagnosis:  Same  Procedure:  Laparoscopic cholecystectomy  Surgeon:  Franky MachoMark Thalia Turkington, M.D.  Anes:  General endotracheal  Indications:  Patient is a 43 year old white male who presents with cholecystitis secondary to cholelithiasis. The risks and benefits of the procedure including bleeding, infection, hepatobiliary injury, and the possibility of an open procedure were fully explained to the patient, who gave informed consent.  Procedure note:  The patient was placed the supine position. After induction of general endotracheal anesthesia, the abdomen was prepped and draped using usual sterile technique with Chloroprep. Surgical site confirmation was performed.  An infraumbilical incision was made down to the fascia. A Veress view was introduced into the abdominal cavity and confirmation of placement was done using the saline drop test. The abdomen was then insufflated to 16 mm mercury pressure. An 11 mm trocar was introduced into the abdominal cavity under visualization without difficulty. The patient was placed in reverse Trendelenburg position and an additional 1 mm trocar was placed the epigastric region a 5 mm trochars were placed the right upper quadrant and right flank regions. Liver was inspected and noted within normal limits. The gallbladder was retracted in a dynamic fashion in order to expose the triangle of Calot. The cystic duct was first identified. Its junction to the infundibulum was fully identified. Endoclips placed proximally and distally on the cystic duct, and the cystic duct was divided. This was likewise done to the cystic artery. The gallbladder was freed away from the gallbladder fossa using Bovie electrocautery. The gallbladder was delivered through the epigastric trocar site using an Endo Catch bag. The gallbladder fossa was inspected and no abnormal  bleeding or bile leakage was noted. Surgicel is placed the gallbladder fossa. All fluid and air were then evacuated from the abdominal cavity prior to removal of the trochars.  All wounds were irrigated with normal saline. All wounds were injected with 0.5% Sensorcaine. The infraumbilical fashion as well as epigastric fascia were reapproximated using 0 Vicryl interrupted sutures. All skin incisions were closed using staples. Betadine ointment and dry sterile dressings were applied.  All tape and needle counts were correct at the end of the procedure. Patient was extubated in the operating room and transferred to PACU in stable condition.  Complications:  None  EBL:  Minimal  Specimen:  Gallbladder

## 2014-02-05 NOTE — Discharge Instructions (Signed)

## 2014-02-05 NOTE — Anesthesia Postprocedure Evaluation (Signed)
  Anesthesia Post-op Note  Patient: Eric Flores  Procedure(s) Performed: Procedure(s): LAPAROSCOPIC CHOLECYSTECTOMY (N/A)  Patient Location: PACU  Anesthesia Type:General  Level of Consciousness: awake, alert , oriented and patient cooperative  Airway and Oxygen Therapy: Patient Spontanous Breathing  Post-op Pain: 3 /10, mild  Post-op Assessment: Post-op Vital signs reviewed, Patient's Cardiovascular Status Stable, Respiratory Function Stable, Patent Airway, Adequate PO intake and Pain level controlled  Post-op Vital Signs: Reviewed and stable  Last Vitals:  Filed Vitals:   02/05/14 0845  BP: 127/80  Pulse: 60  Temp:   Resp: 12    Complications: No apparent anesthesia complications

## 2014-02-05 NOTE — Anesthesia Preprocedure Evaluation (Signed)
Anesthesia Evaluation  Patient identified by MRN, date of birth, ID band Patient awake    Reviewed: Allergy & Precautions, H&P , NPO status , Patient's Chart, lab work & pertinent test results  Airway Mallampati: II TM Distance: >3 FB     Dental  (+) Poor Dentition, Loose, Dental Advisory Given,    Pulmonary Current Smoker,  breath sounds clear to auscultation        Cardiovascular negative cardio ROS  Rhythm:Regular Rate:Normal     Neuro/Psych    GI/Hepatic negative GI ROS, GERD-  Medicated and Controlled,  Endo/Other    Renal/GU      Musculoskeletal   Abdominal   Peds  Hematology   Anesthesia Other Findings   Reproductive/Obstetrics                           Anesthesia Physical Anesthesia Plan  ASA: II  Anesthesia Plan: General   Post-op Pain Management:    Induction: Intravenous, Rapid sequence and Cricoid pressure planned  Airway Management Planned: Oral ETT  Additional Equipment:   Intra-op Plan:   Post-operative Plan: Extubation in OR  Informed Consent: I have reviewed the patients History and Physical, chart, labs and discussed the procedure including the risks, benefits and alternatives for the proposed anesthesia with the patient or authorized representative who has indicated his/her understanding and acceptance.     Plan Discussed with:   Anesthesia Plan Comments:         Anesthesia Quick Evaluation

## 2014-02-05 NOTE — Interval H&P Note (Signed)
History and Physical Interval Note:  02/05/2014 7:12 AM  Eric Flores  has presented today for surgery, with the diagnosis of cholelithiasis  The various methods of treatment have been discussed with the patient and family. After consideration of risks, benefits and other options for treatment, the patient has consented to  Procedure(s): LAPAROSCOPIC CHOLECYSTECTOMY (N/A) as a surgical intervention .  The patient's history has been reviewed, patient examined, no change in status, stable for surgery.  I have reviewed the patient's chart and labs.  Questions were answered to the patient's satisfaction.     Franky MachoJENKINS,Tiffay Pinette A

## 2014-02-05 NOTE — Transfer of Care (Signed)
Immediate Anesthesia Transfer of Care Note  Patient: Eric Flores  Procedure(s) Performed: Procedure(s): LAPAROSCOPIC CHOLECYSTECTOMY (N/A)  Patient Location: PACU  Anesthesia Type:General  Level of Consciousness: awake, alert  and oriented  Airway & Oxygen Therapy: Patient Spontanous Breathing and Patient connected to face mask oxygen  Post-op Assessment: Report given to PACU RN and Post -op Vital signs reviewed and stable  Post vital signs: Reviewed and stable  Complications: No apparent anesthesia complications

## 2014-02-05 NOTE — Addendum Note (Signed)
Addendum created 02/05/14 1419 by Moshe SalisburyKaren E Jamare Vanatta, CRNA   Modules edited: Anesthesia Medication Administration, Charges VN

## 2014-02-06 ENCOUNTER — Encounter (HOSPITAL_COMMUNITY): Payer: Self-pay | Admitting: General Surgery

## 2015-03-06 IMAGING — US US ABDOMEN LIMITED
1 series · 14 of 25 positions shown · non-contrast
Comparison: None.

CLINICAL DATA: Right upper quadrant pain, known gallstones

EXAM:
US ABDOMEN LIMITED - RIGHT UPPER QUADRANT

[Series 1: us abdomen limited · 0.21mm/px · 14 of 66 slices shown]
[im 1/66]
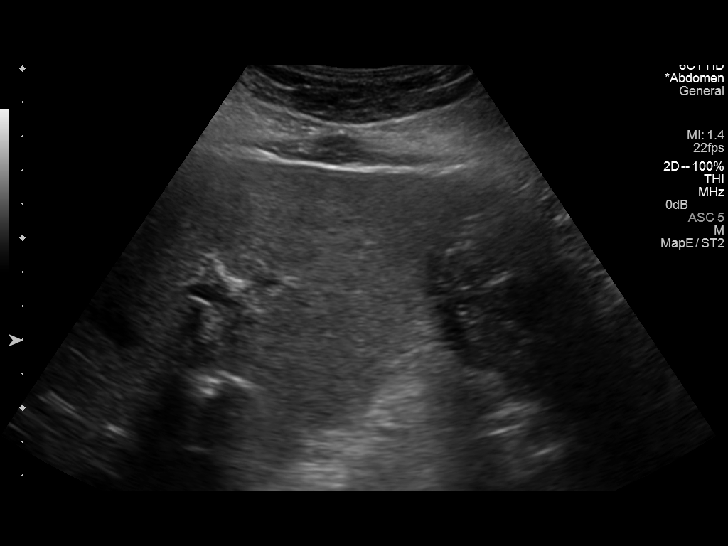
[im 6/66]
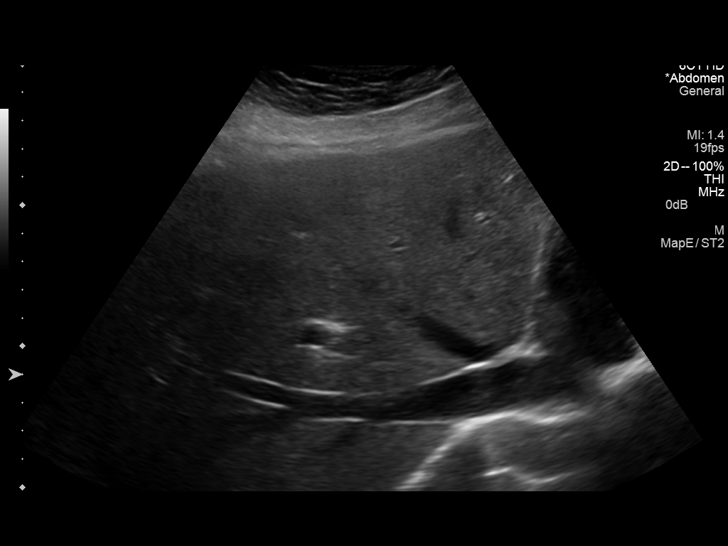
[im 11/66]
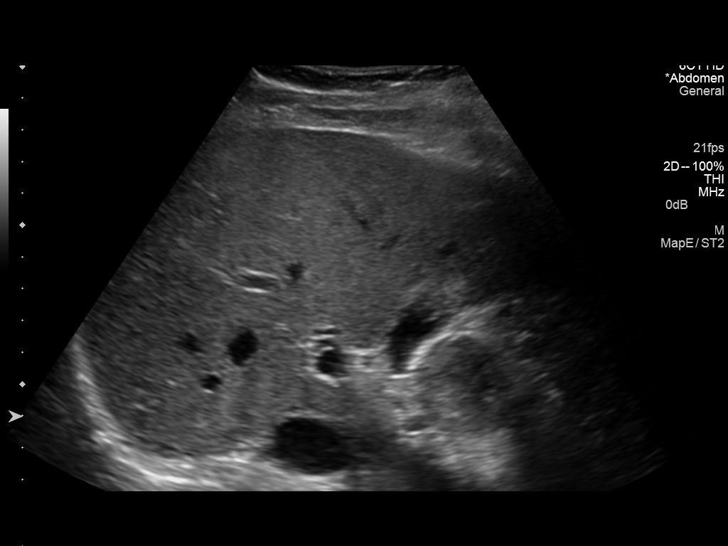
[im 17/66]
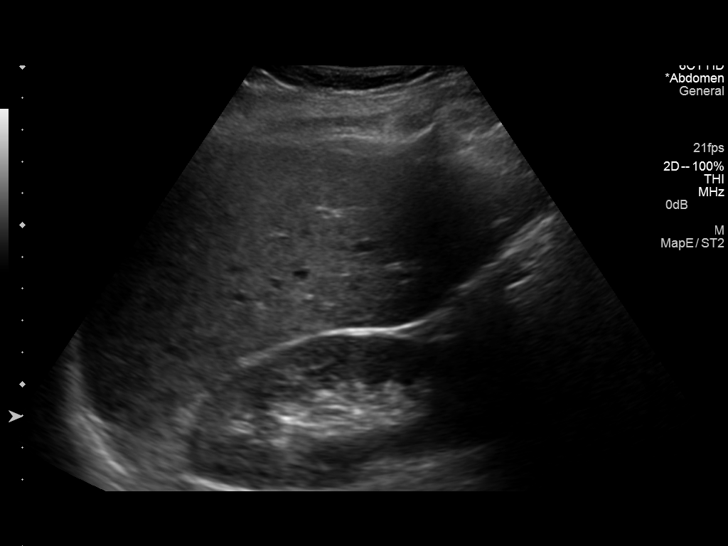
[im 22/66]
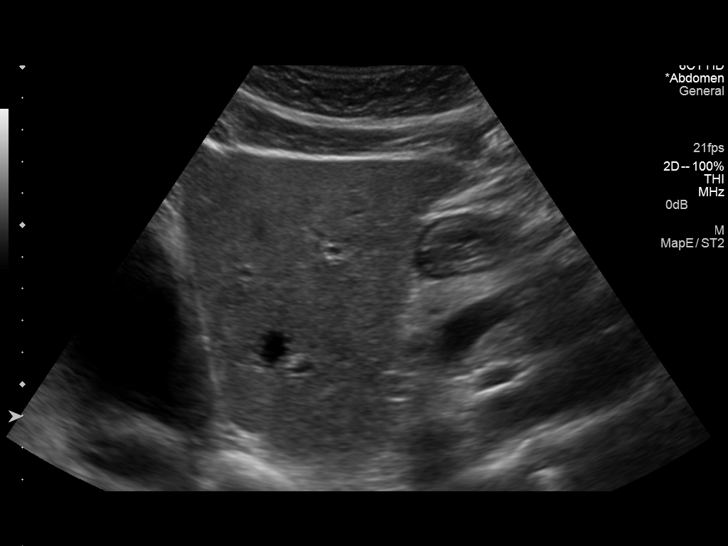
[im 25/66]
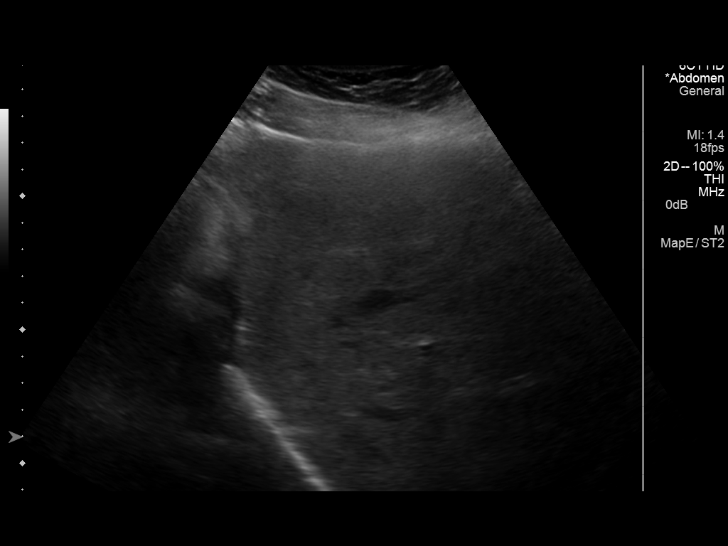
[im 30/66]
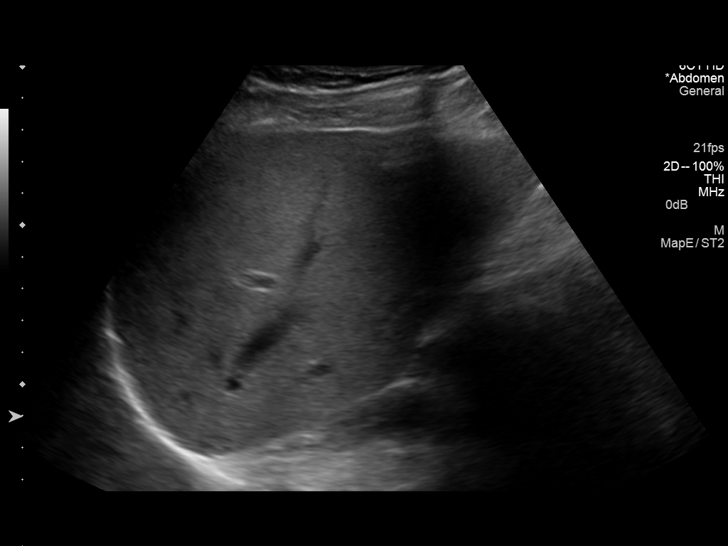
[im 36/66]
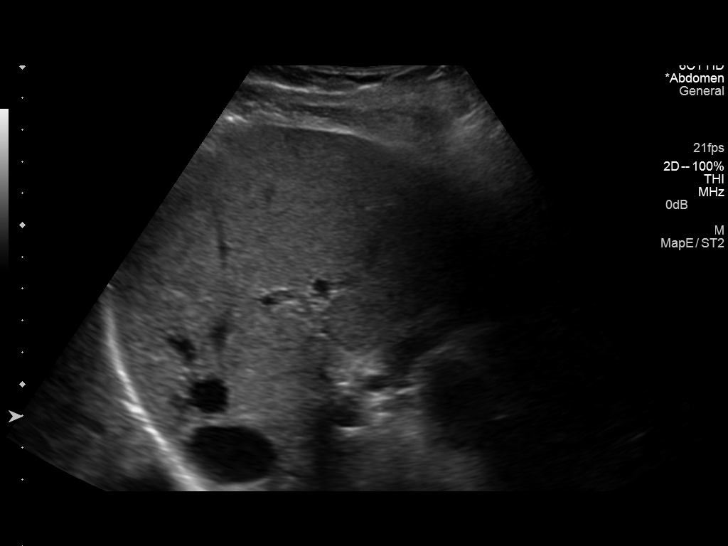
[im 41/66]
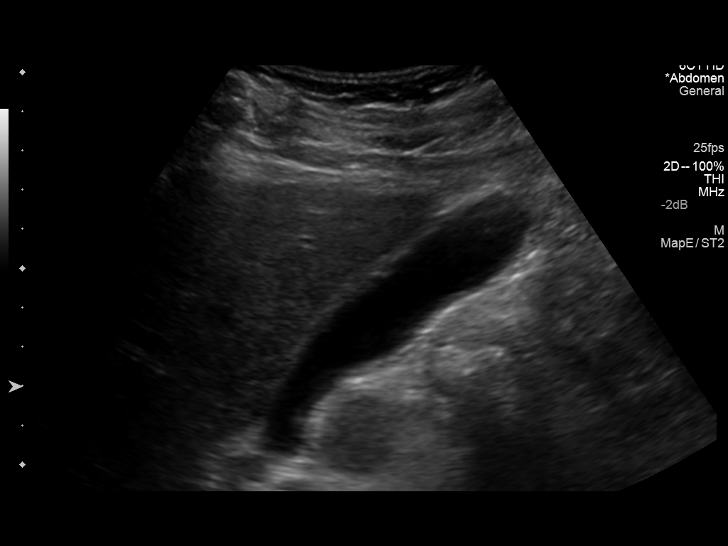
[im 44/66]
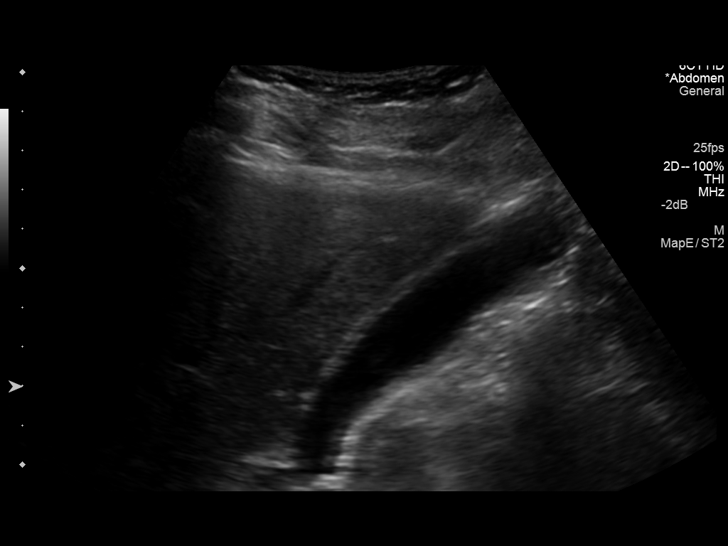
[im 49/66]
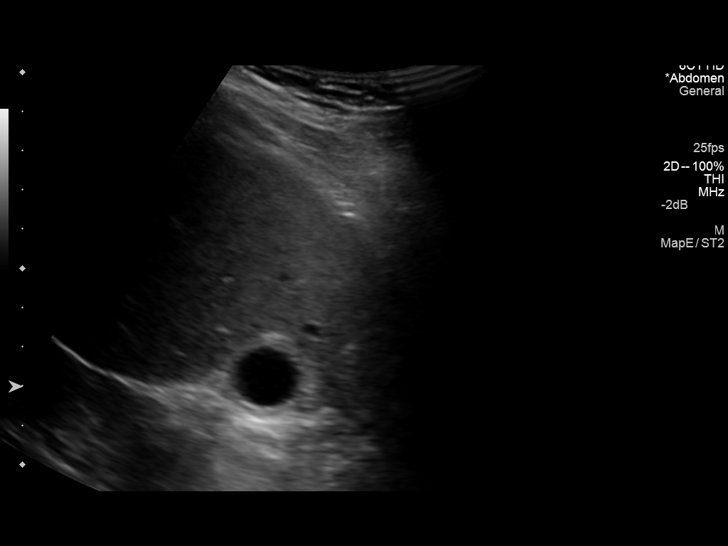
[im 55/66]
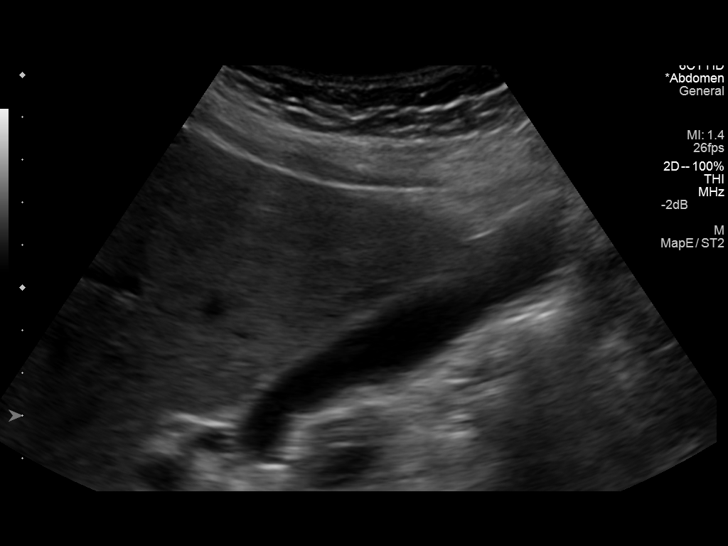
[im 60/66]
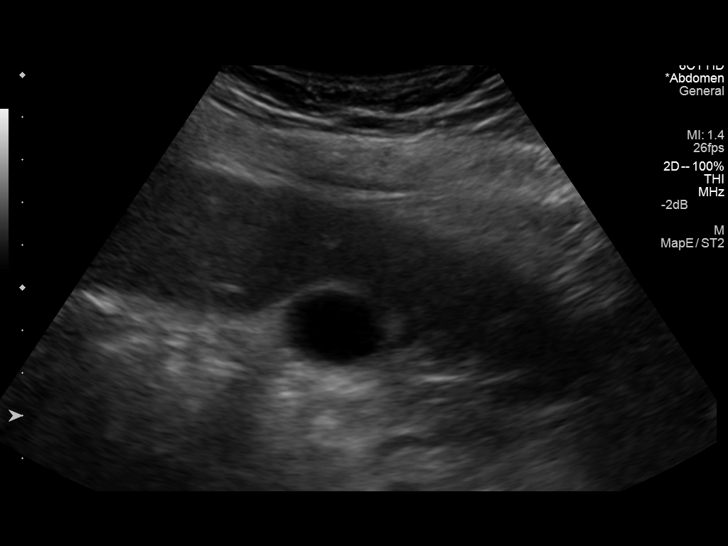
[im 66/66]
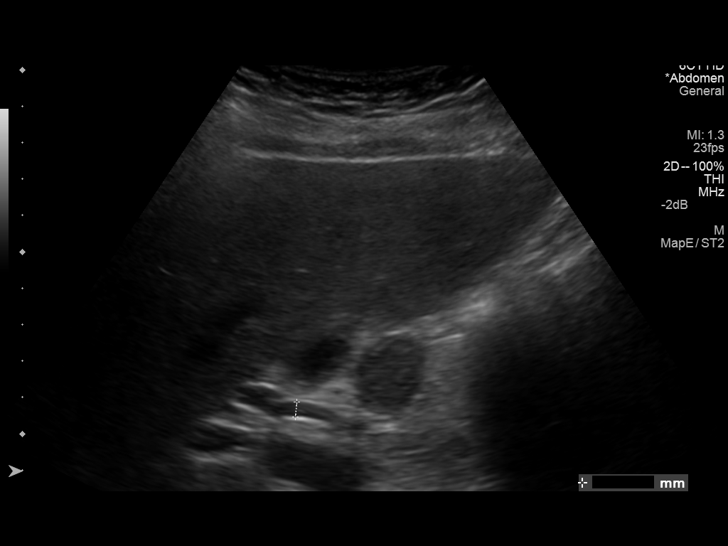

[14 of 25 positions shown; findings below may reference images not displayed]

FINDINGS: Gallbladder:

Suspected sludge and tiny nonshadowing gallstones. Gallbladder is
underdistended with mild gallbladder wall thickening, measuring
mm. Negative sonographic Murphy's sign.

Common bile duct:

Diameter: 6 mm, at the upper limits of normal.

Liver:

No focal lesion identified. Within normal limits in parenchymal
echogenicity.
IMPRESSION: Suspected sludge and tiny nonshadowing gallstones, but without
convincing findings suggest acute cholecystitis. Negative
sonographic Murphy's sign.

## 2015-03-06 IMAGING — CR DG CHEST 2V
2 series · 2 of 2 positions shown · non-contrast
Comparison: Thoracic spine radiograph from 07/23/2007

CLINICAL DATA: Right upper quadrant abdominal pain, chest pain and
cough. History of smoking.

EXAM:
CHEST  2 VIEW

[view not recorded (1 of 2)]
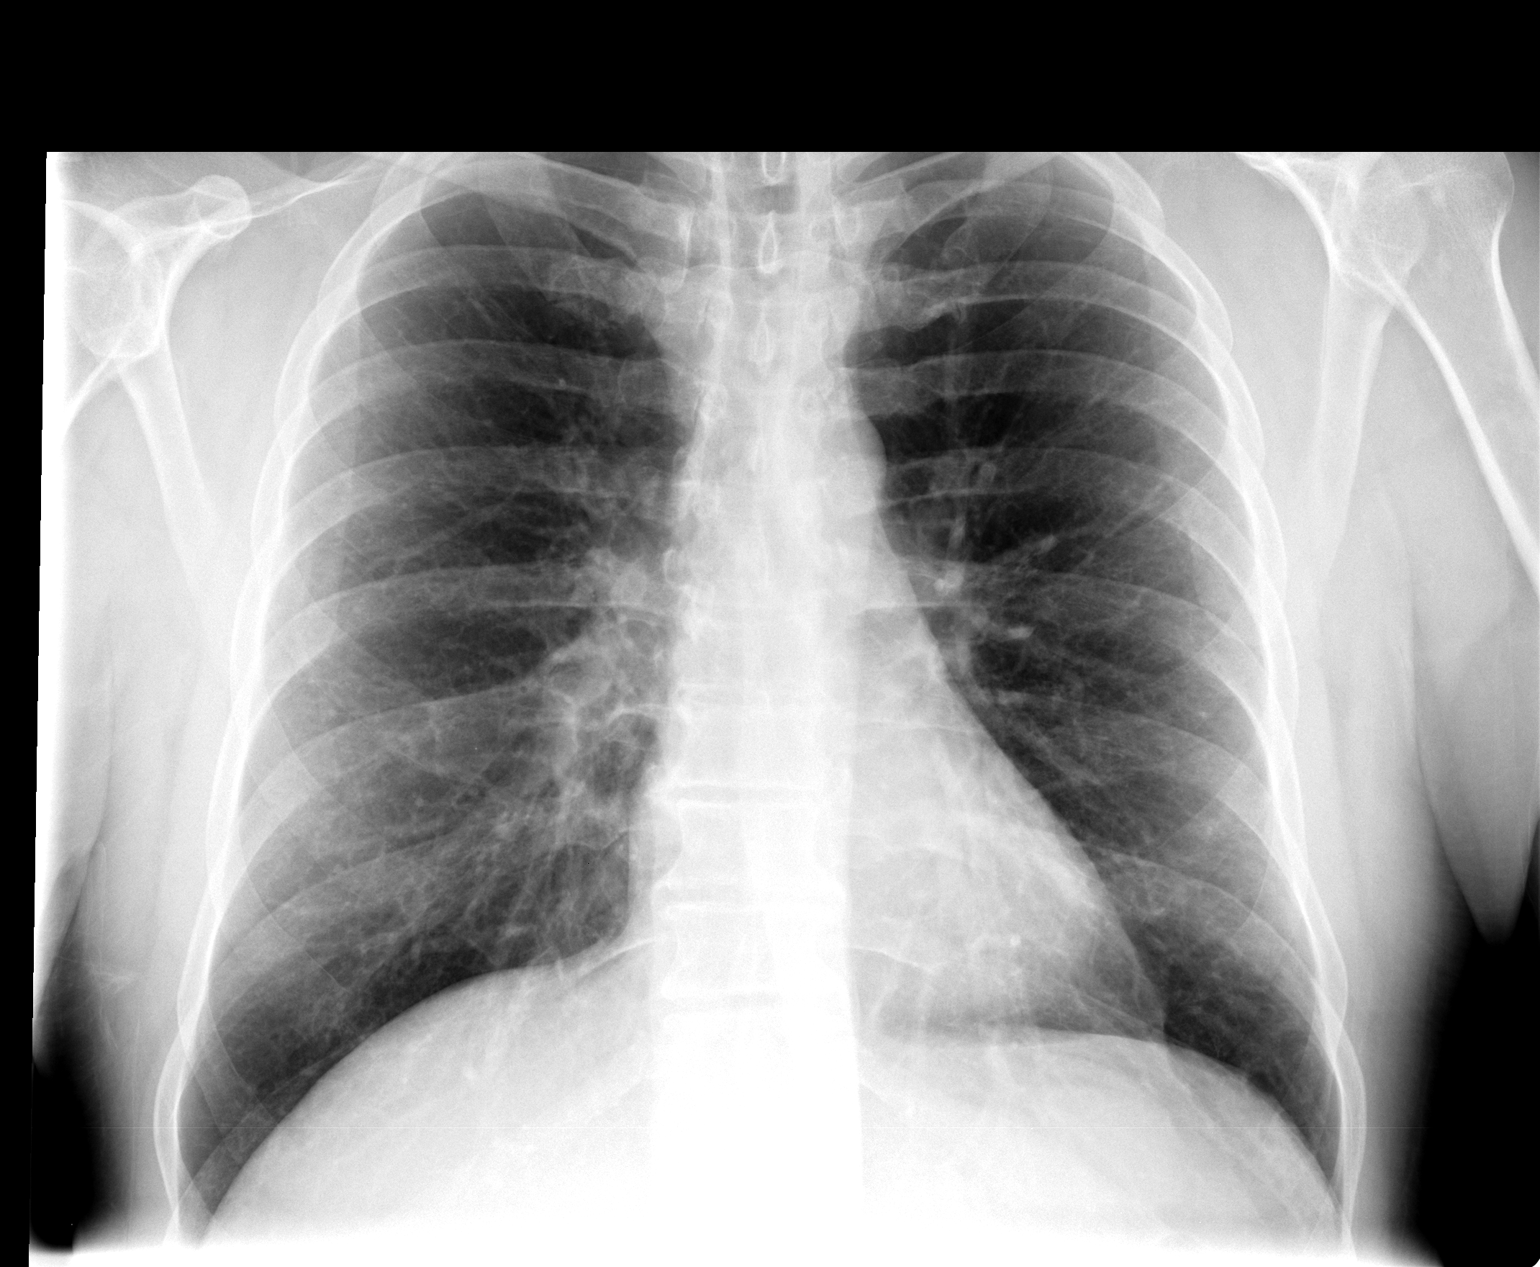

[view not recorded (2 of 2)]
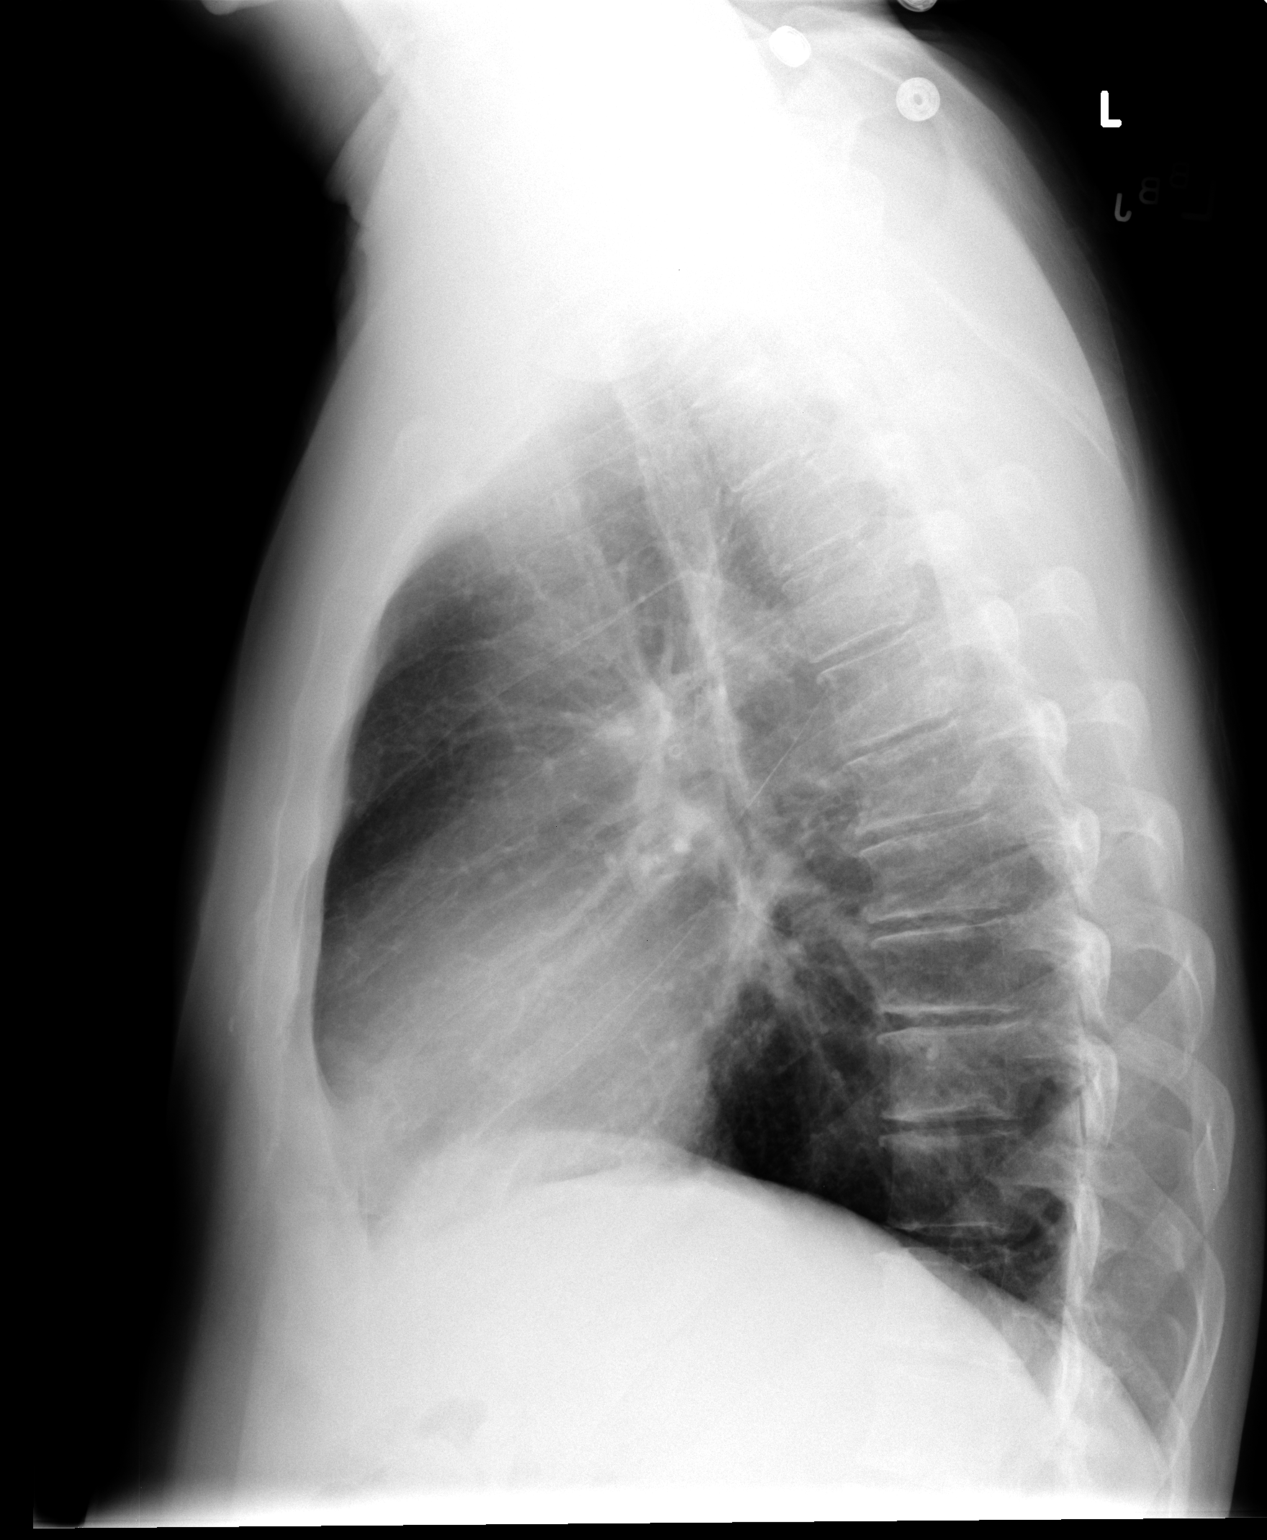

[2 of 2 positions shown; findings below may reference images not displayed]

FINDINGS: The lungs are well-aerated and clear. There is no evidence of focal
opacification, pleural effusion or pneumothorax.

The heart is normal in size; the mediastinal contour is within
normal limits. No acute osseous abnormalities are seen.
IMPRESSION: No acute cardiopulmonary process seen.

## 2015-03-31 IMAGING — US US ABDOMEN LIMITED
1 series · 14 of 25 positions shown · non-contrast
Comparison: 01/05/2014

CLINICAL DATA: Right upper quadrant abdominal pain.

EXAM:
US ABDOMEN LIMITED - RIGHT UPPER QUADRANT

[Series 1: us abdomen limited · 0.21mm/px · 14 of 58 slices shown]
[im 1/58]
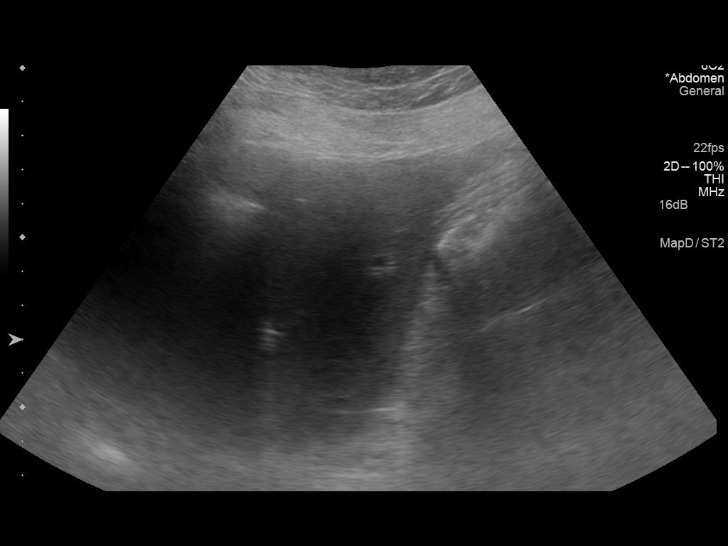
[im 5/58]
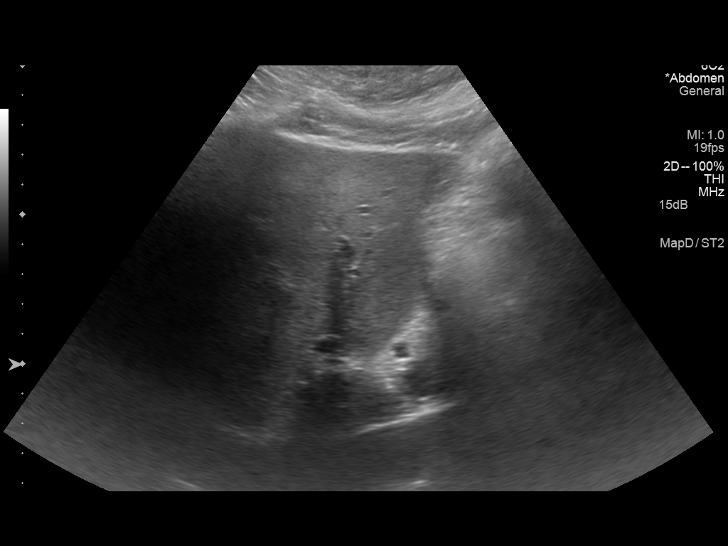
[im 10/58]
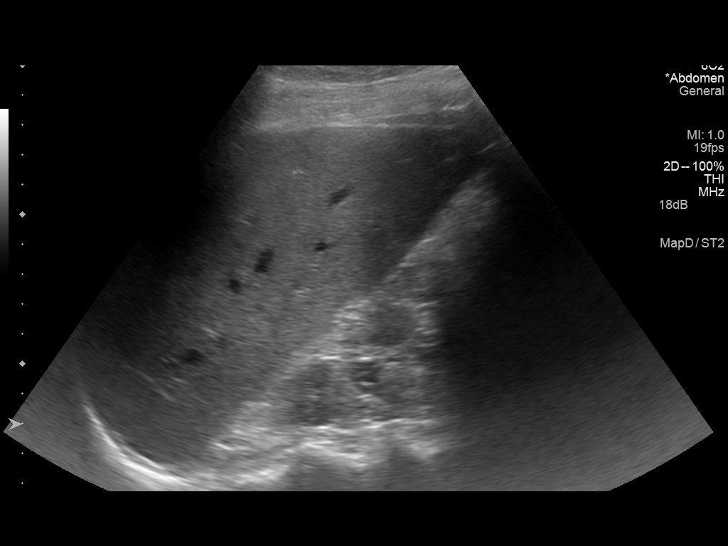
[im 15/58]
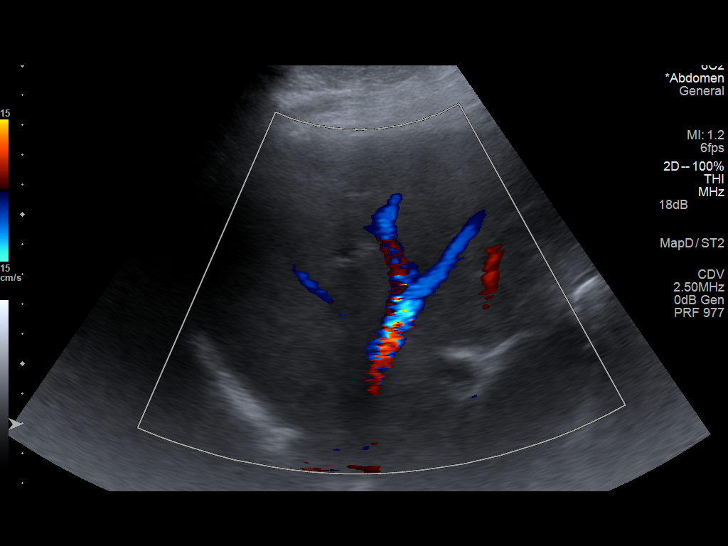
[im 20/58]
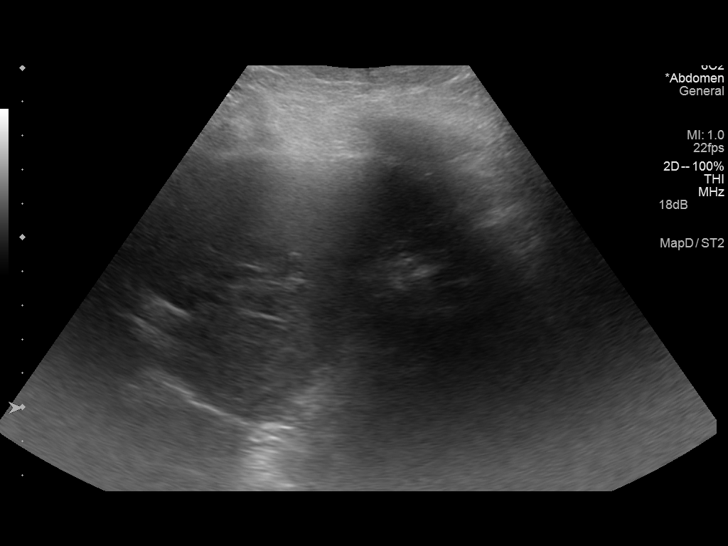
[im 22/58]
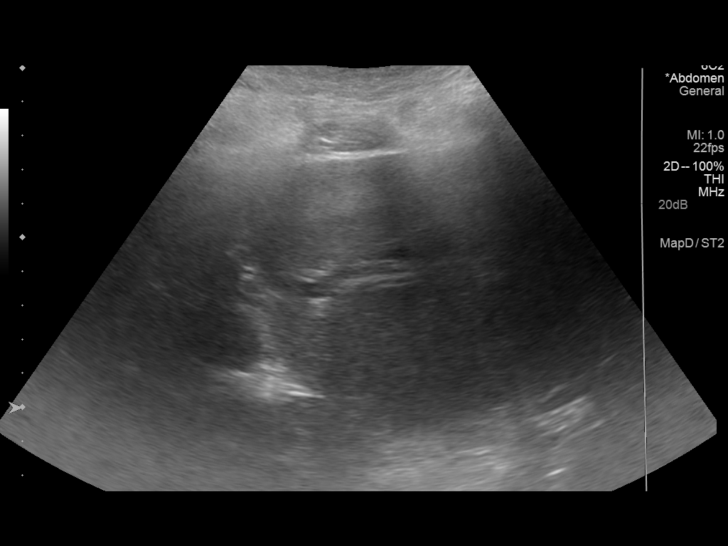
[im 27/58]
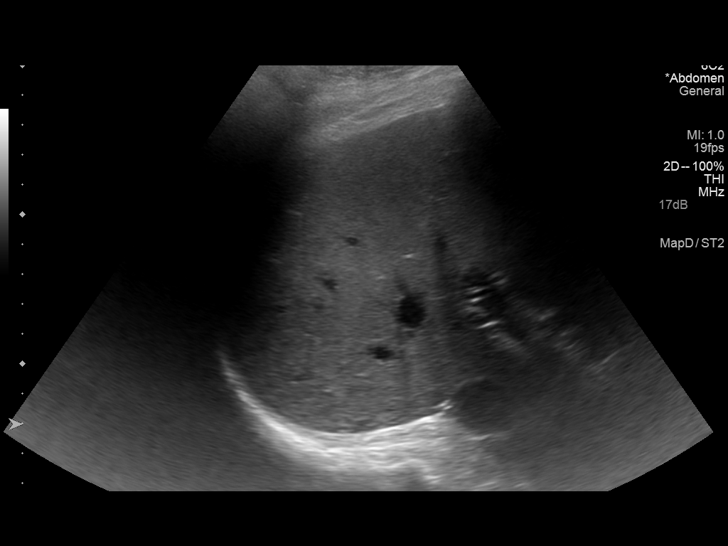
[im 31/58]
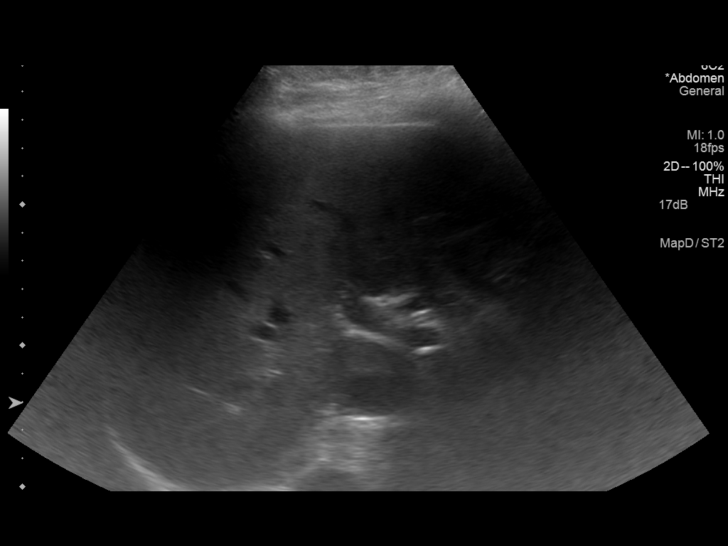
[im 36/58]
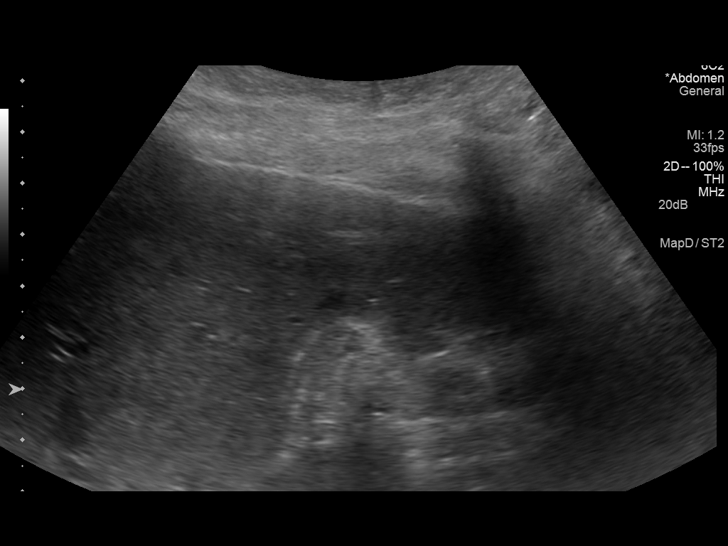
[im 39/58]
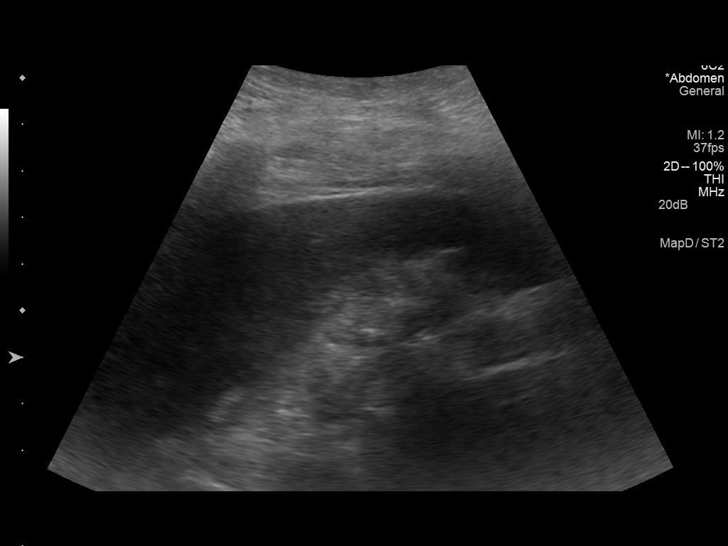
[im 43/58]
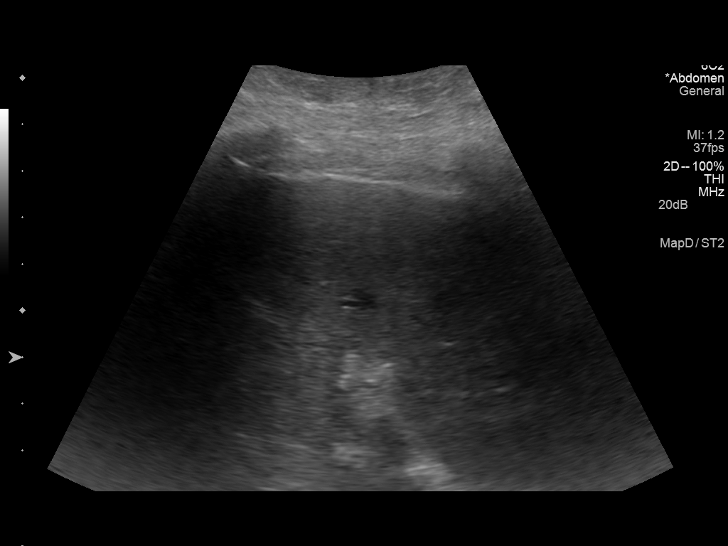
[im 48/58]
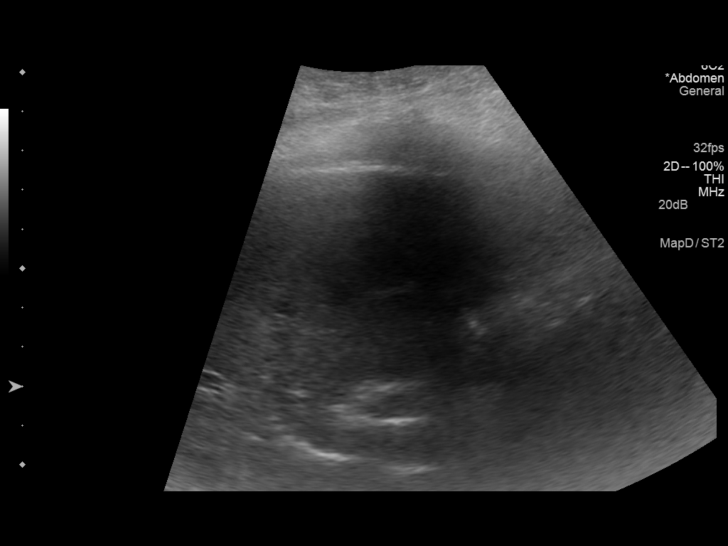
[im 53/58]
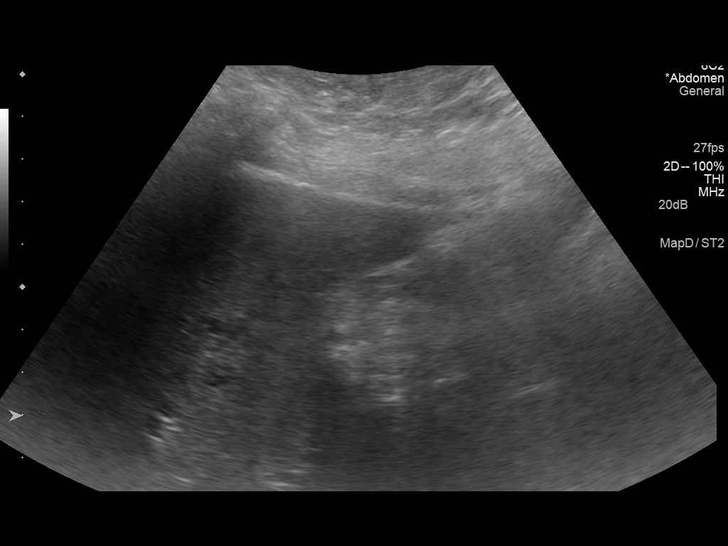
[im 58/58]
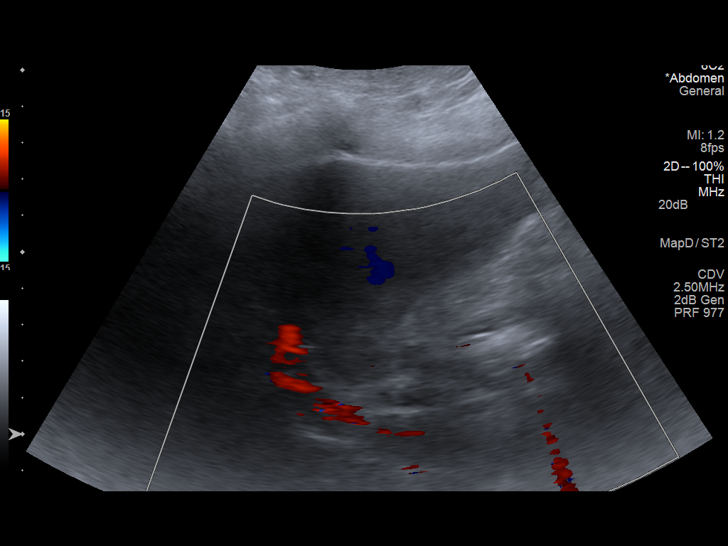

[14 of 25 positions shown; findings below may reference images not displayed]

FINDINGS: Gallbladder:

Echogenic material fills the gallbladder, some of which appears to
be shadowing. Appearance compatible with sludge with internal
gallstones. Gallbladder wall thickening is present at 4 mm.

Common bile duct:

Diameter: 7 mm, abnormally dilated.

Liver:

No focal lesion identified. Within normal limits in parenchymal
echogenicity.
IMPRESSION: 1. Gallstones and echogenic material in the gallbladder favoring
some overlying sludge. Gallbladder wall thickening is present, as
before. Correlate clinically in assessing for acute cholecystitis.
2. Mildly dilated common bile duct at 7 mm. We did not directly
visualized choledocholithiasis.

## 2016-10-15 DEATH — deceased
